# Patient Record
Sex: Female | Born: 1965 | Hispanic: Yes | Smoking: Never smoker
Health system: Southern US, Community
[De-identification: ages and names within clinical notes are randomized; demographics above are authoritative.]

## PROBLEM LIST (undated history)

## (undated) DIAGNOSIS — K5190 Ulcerative colitis, unspecified, without complications: Secondary | ICD-10-CM

## (undated) DIAGNOSIS — D649 Anemia, unspecified: Secondary | ICD-10-CM

## (undated) DIAGNOSIS — R079 Chest pain, unspecified: Secondary | ICD-10-CM

## (undated) DIAGNOSIS — Z531 Procedure and treatment not carried out because of patient's decision for reasons of belief and group pressure: Secondary | ICD-10-CM

## (undated) DIAGNOSIS — IMO0001 Reserved for inherently not codable concepts without codable children: Secondary | ICD-10-CM

## (undated) DIAGNOSIS — Z8489 Family history of other specified conditions: Secondary | ICD-10-CM

## (undated) HISTORY — PX: TONSILLECTOMY: SUR1361

## (undated) HISTORY — PX: OTHER SURGICAL HISTORY: SHX169

## (undated) HISTORY — PX: TUBAL LIGATION: SHX77

## (undated) HISTORY — PX: CHOLECYSTECTOMY: SHX55

---

## 2007-07-03 ENCOUNTER — Ambulatory Visit: Payer: Self-pay

## 2007-10-16 ENCOUNTER — Inpatient Hospital Stay: Payer: Self-pay | Admitting: Unknown Physician Specialty

## 2008-01-09 ENCOUNTER — Ambulatory Visit: Payer: Self-pay | Admitting: Internal Medicine

## 2008-04-17 ENCOUNTER — Ambulatory Visit: Payer: Self-pay | Admitting: Unknown Physician Specialty

## 2008-04-29 ENCOUNTER — Ambulatory Visit: Payer: Self-pay | Admitting: General Surgery

## 2008-09-23 ENCOUNTER — Ambulatory Visit: Payer: Self-pay | Admitting: Gastroenterology

## 2009-07-09 ENCOUNTER — Ambulatory Visit: Payer: Self-pay | Admitting: Internal Medicine

## 2011-05-08 ENCOUNTER — Ambulatory Visit: Payer: Self-pay | Admitting: Family Medicine

## 2013-06-11 ENCOUNTER — Emergency Department (HOSPITAL_COMMUNITY)
Admission: EM | Admit: 2013-06-11 | Discharge: 2013-06-11 | Disposition: A | Discharge status: Home / Self Care | Payer: Medicaid Other | Attending: Emergency Medicine | Admitting: Emergency Medicine

## 2013-06-11 ENCOUNTER — Encounter (HOSPITAL_COMMUNITY): Payer: Self-pay | Admitting: Emergency Medicine

## 2013-06-11 DIAGNOSIS — J029 Acute pharyngitis, unspecified: Secondary | ICD-10-CM | POA: Insufficient documentation

## 2013-06-11 DIAGNOSIS — R599 Enlarged lymph nodes, unspecified: Secondary | ICD-10-CM | POA: Insufficient documentation

## 2013-06-11 DIAGNOSIS — Z8719 Personal history of other diseases of the digestive system: Secondary | ICD-10-CM | POA: Insufficient documentation

## 2013-06-11 DIAGNOSIS — Z79899 Other long term (current) drug therapy: Secondary | ICD-10-CM | POA: Insufficient documentation

## 2013-06-11 HISTORY — DX: Ulcerative colitis, unspecified, without complications: K51.90

## 2013-06-11 MED ORDER — HYDROCODONE-ACETAMINOPHEN 7.5-325 MG/15ML PO SOLN: 10.0000 mL | Freq: Four times a day (QID) | ORAL | Status: DC | PRN

## 2013-06-11 MED ORDER — IBUPROFEN 800 MG PO TABS: 800.0000 mg | ORAL_TABLET | Freq: Three times a day (TID) | ORAL | Status: DC | PRN

## 2013-06-11 NOTE — ED Notes (Signed)
Pt sts cannot take ibuprofen due to ulcerative colitis.

## 2013-06-11 NOTE — ED Provider Notes (Signed)
CSN: [id number]     Arrival date & time 06/11/13  1325 History   First MD Initiated Contact with Patient 06/11/13 1424     Chief Complaint  Patient presents with  . Sore Throat   (Consider location/radiation/quality/duration/timing/severity/associated sxs/prior Treatment) HPI Comments: Patient reports she has had sore throat, body aches, right ear pain x4 days. States that her child is sick at home with the same. Denies cough, shortness of breath. The patient is eating and drinking normally  Patient is a 47 y.o. female presenting with pharyngitis. The history is provided by the patient.  Sore Throat Associated symptoms include a sore throat. Pertinent negatives include no chills, congestion, coughing or fever.    Past Medical History  Diagnosis Date  . Ulcerative colitis    History reviewed. No pertinent past surgical history. History reviewed. No pertinent family history. History  Substance Use Topics  . Smoking status: Never Smoker   . Smokeless tobacco: Not on file  . Alcohol Use: No   OB History   Grav Para Term Preterm Abortions TAB SAB Ect Mult Living                 Review of Systems  Constitutional: Negative for fever and chills.  HENT: Positive for ear pain and sore throat. Negative for congestion, rhinorrhea and trouble swallowing (Pain with swallowing, no difficulty).   Respiratory: Negative for cough and shortness of breath.     Allergies  Prednisone  Home Medications   Current Outpatient Rx  Name  Route  Sig  Dispense  Refill  . acetaminophen (TYLENOL) 500 MG tablet   Oral   Take 1,000 mg by mouth every 6 (six) hours as needed for pain.         . Probiotic Product (PROBIOTIC DAILY PO)   Oral   Take 1 tablet by mouth daily.          BP 136/84  Pulse 93  Temp(Src) 98.1 F (36.7 C) (Oral)  Resp 20  SpO2 100% Physical Exam  Nursing note and vitals reviewed. Constitutional: She appears well-developed and well-nourished. No distress.  HENT:   Head: Atraumatic. Macrocephalic.  Mouth/Throat: Uvula is midline. Mucous membranes are not dry. No edematous. Posterior oropharyngeal erythema present. No oropharyngeal exudate, posterior oropharyngeal edema or tonsillar abscesses.  Eyes: Conjunctivae are normal.  Neck: Normal range of motion. Neck supple. No tracheal deviation present.  Cardiovascular: Normal rate and regular rhythm.   Pulmonary/Chest: Effort normal and breath sounds normal. No stridor. No respiratory distress. She has no wheezes. She has no rales.  Lymphadenopathy:    She has cervical adenopathy.  Neurological: She is alert.  Skin: She is not diaphoretic.    ED Course  Procedures (including critical care time) Labs Review Labs Reviewed  RAPID STREP SCREEN  CULTURE, GROUP A STREP   Imaging Review No results found.  MDM   1. Pharyngitis    Patient with sore throat and body aches x4 days. Sick contacts with same at home. Strep test is negative here, culture sent. No airway concerns.  Patient discharged home with pain medication, strongly advised to drink plenty of fluids.Discussed result, findings, treatment, and follow up  with patient.  Pt given return precautions.  Pt verbalizes understanding and agrees with plan.      I doubt any other EMC precluding discharge at this time including, but not necessarily limited to the following: deep space neck infection.    Trixie Dredge, PA-C 06/11/13 1606

## 2013-06-11 NOTE — ED Notes (Signed)
Pt c/o sore throat and body aches x 4 days 

## 2013-06-12 NOTE — ED Provider Notes (Signed)
Medical screening examination/treatment/procedure(s) were performed by non-physician practitioner and as supervising physician I was immediately available for consultation/collaboration.    Gilda Crease, MD 06/12/13 1351

## 2013-11-10 DIAGNOSIS — R079 Chest pain, unspecified: Secondary | ICD-10-CM

## 2013-11-10 DIAGNOSIS — D649 Anemia, unspecified: Secondary | ICD-10-CM

## 2013-11-10 HISTORY — DX: Anemia, unspecified: D64.9

## 2013-11-10 HISTORY — DX: Chest pain, unspecified: R07.9

## 2013-12-01 ENCOUNTER — Emergency Department (HOSPITAL_COMMUNITY): Payer: Medicaid Other

## 2013-12-01 ENCOUNTER — Encounter (HOSPITAL_COMMUNITY): Payer: Self-pay | Admitting: Emergency Medicine

## 2013-12-01 ENCOUNTER — Observation Stay (HOSPITAL_COMMUNITY): Payer: Medicaid Other

## 2013-12-01 ENCOUNTER — Inpatient Hospital Stay (HOSPITAL_COMMUNITY)
Admission: EM | Admit: 2013-12-01 | Discharge: 2013-12-04 | DRG: 386 | Disposition: A | Discharge status: Home / Self Care | Payer: Medicaid Other | Attending: Internal Medicine | Admitting: Internal Medicine

## 2013-12-01 DIAGNOSIS — Z531 Procedure and treatment not carried out because of patient's decision for reasons of belief and group pressure: Secondary | ICD-10-CM

## 2013-12-01 DIAGNOSIS — Z79899 Other long term (current) drug therapy: Secondary | ICD-10-CM

## 2013-12-01 DIAGNOSIS — K5190 Ulcerative colitis, unspecified, without complications: Principal | ICD-10-CM | POA: Diagnosis present

## 2013-12-01 DIAGNOSIS — D62 Acute posthemorrhagic anemia: Secondary | ICD-10-CM | POA: Diagnosis present

## 2013-12-01 DIAGNOSIS — K921 Melena: Secondary | ICD-10-CM | POA: Diagnosis present

## 2013-12-01 DIAGNOSIS — D649 Anemia, unspecified: Secondary | ICD-10-CM

## 2013-12-01 DIAGNOSIS — D500 Iron deficiency anemia secondary to blood loss (chronic): Secondary | ICD-10-CM | POA: Diagnosis present

## 2013-12-01 DIAGNOSIS — R079 Chest pain, unspecified: Secondary | ICD-10-CM | POA: Diagnosis present

## 2013-12-01 HISTORY — DX: Procedure and treatment not carried out because of patient's decision for reasons of belief and group pressure: Z53.1

## 2013-12-01 HISTORY — DX: Reserved for inherently not codable concepts without codable children: IMO0001

## 2013-12-01 HISTORY — DX: Anemia, unspecified: D64.9

## 2013-12-01 HISTORY — DX: Chest pain, unspecified: R07.9

## 2013-12-01 HISTORY — DX: Family history of other specified conditions: Z84.89

## 2013-12-01 LAB — LIPASE, BLOOD: Lipase: 37 U/L (ref 11–59)

## 2013-12-01 LAB — HEPATIC FUNCTION PANEL
ALT: 7 U/L (ref 0–35)
AST: 11 U/L (ref 0–37)
Albumin: 2.5 g/dL — ABNORMAL LOW (ref 3.5–5.2)
Alkaline Phosphatase: 54 U/L (ref 39–117)
Bilirubin, Direct: <0.2 mg/dL (ref 0.0–0.3)
Total Bilirubin: <0.2 mg/dL — ABNORMAL LOW (ref 0.3–1.2)
Total Protein: 7 g/dL (ref 6.0–8.3)

## 2013-12-01 LAB — CBC WITH DIFFERENTIAL/PLATELET
Basophils Absolute: 0 10*3/uL (ref 0.0–0.1)
Basophils Relative: 0 % (ref 0–1)
Eosinophils Absolute: 0.4 10*3/uL (ref 0.0–0.7)
Eosinophils Relative: 3 % (ref 0–5)
HCT: 22.3 % — ABNORMAL LOW (ref 36.0–46.0)
Hemoglobin: 7 g/dL — ABNORMAL LOW (ref 12.0–15.0)
Lymphocytes Relative: 12 % (ref 12–46)
Lymphs Abs: 1.6 10*3/uL (ref 0.7–4.0)
MCH: 24.1 pg — ABNORMAL LOW (ref 26.0–34.0)
MCHC: 31.4 g/dL (ref 30.0–36.0)
MCV: 76.6 fL — ABNORMAL LOW (ref 78.0–100.0)
Monocytes Absolute: 1.3 10*3/uL — ABNORMAL HIGH (ref 0.1–1.0)
Monocytes Relative: 10 % (ref 3–12)
Neutro Abs: 9.7 10*3/uL — ABNORMAL HIGH (ref 1.7–7.7)
Neutrophils Relative %: 75 % (ref 43–77)
Platelets: 767 10*3/uL — ABNORMAL HIGH (ref 150–400)
RBC: 2.91 MIL/uL — ABNORMAL LOW (ref 3.87–5.11)
RDW: 15.6 % — ABNORMAL HIGH (ref 11.5–15.5)
WBC Morphology: INCREASED
WBC: 13 10*3/uL — ABNORMAL HIGH (ref 4.0–10.5)

## 2013-12-01 LAB — BASIC METABOLIC PANEL
BUN: 6 mg/dL (ref 6–23)
CO2: 26 mEq/L (ref 19–32)
Calcium: 8.6 mg/dL (ref 8.4–10.5)
Chloride: 98 mEq/L (ref 96–112)
Creatinine, Ser: 0.63 mg/dL (ref 0.50–1.10)
GFR calc Af Amer: >90 mL/min (ref >90)
GFR calc non Af Amer: >90 mL/min (ref >90)
Glucose, Bld: 82 mg/dL (ref 70–99)
Potassium: 3.8 mEq/L (ref 3.7–5.3)
Sodium: 137 mEq/L (ref 137–147)

## 2013-12-01 LAB — POC OCCULT BLOOD, ED: FECAL OCCULT BLD: NEGATIVE

## 2013-12-01 LAB — PRO B NATRIURETIC PEPTIDE: Pro B Natriuretic peptide (BNP): 90.7 pg/mL (ref 0–125)

## 2013-12-01 LAB — D-DIMER, QUANTITATIVE (NOT AT ARMC): D DIMER QUANT: 1.26 ug{FEU}/mL — AB (ref 0.00–0.48)

## 2013-12-01 LAB — TROPONIN I
Troponin I: <0.3 ng/mL (ref <0.30)
Troponin I: <0.3 ng/mL (ref <0.30)

## 2013-12-01 MED ORDER — SODIUM CHLORIDE 0.9 % IV SOLN
INTRAVENOUS | Status: DC
  Administered 2013-12-01 – 2013-12-04 (×2): via INTRAVENOUS

## 2013-12-01 MED ORDER — MORPHINE SULFATE 2 MG/ML IJ SOLN: 2.0000 mg | INTRAMUSCULAR | Status: DC | PRN

## 2013-12-01 MED ORDER — IOHEXOL 300 MG/ML  SOLN
80.0000 mL | Freq: Once | INTRAMUSCULAR | Status: AC | PRN
Start: 2013-12-01 — End: 2013-12-01
  Administered 2013-12-01: 65 mL via INTRAVENOUS

## 2013-12-01 MED ORDER — ONDANSETRON HCL 4 MG PO TABS: 4.0000 mg | ORAL_TABLET | Freq: Four times a day (QID) | ORAL | Status: DC | PRN

## 2013-12-01 MED ORDER — IOHEXOL 300 MG/ML  SOLN: 25.0000 mL | INTRAMUSCULAR | Status: DC

## 2013-12-01 MED ORDER — GI COCKTAIL ~~LOC~~
30.0000 mL | Freq: Once | ORAL | Status: AC
  Administered 2013-12-01: 30 mL via ORAL
  Filled 2013-12-01: qty 30

## 2013-12-01 MED ORDER — ONDANSETRON HCL 4 MG/2ML IJ SOLN: 4.0000 mg | Freq: Four times a day (QID) | INTRAMUSCULAR | Status: DC | PRN

## 2013-12-01 NOTE — ED Provider Notes (Signed)
CSN: [id number]     Arrival date & time 12/01/13  1416 History   First MD Initiated Contact with Patient 12/01/13 1500     Chief Complaint  Patient presents with  . Chest Pain    worse when standing     (Consider location/radiation/quality/duration/timing/severity/associated sxs/prior Treatment) HPI Comments: Patient presents with substernal chest pain onset last night around 8 PM. The pain came on at rest and has been constant all night long and all day today. He acutely got worse this afternoon and became more stabbing about 2 hours ago. It radiates to her left arm and upper back. Is associated with shortness of breath and nausea. She had received nitroglycerin from EMS was improved the pain. She denies any cardiac history. She's never had a stress test. Her only medical history is ulcerative colitis. She does not smoke. She never had this pain before yesterday. Is better with lying down worse with sitting up. She has some soreness with palpation of her chest wall movement of her left arm. She denies any symptoms currently.  The history is provided by the patient and the EMS personnel.    Past Medical History  Diagnosis Date  . Ulcerative colitis    Past Surgical History  Procedure Laterality Date  . Tubal ligation    . Cholecystectomy    . Tonsillectomy     No family history on file. History  Substance Use Topics  . Smoking status: Never Smoker   . Smokeless tobacco: Not on file  . Alcohol Use: No   OB History   Grav Para Term Preterm Abortions TAB SAB Ect Mult Living                 Review of Systems  Constitutional: Negative for fever, activity change and appetite change.  HENT: Negative for congestion and rhinorrhea.   Respiratory: Positive for chest tightness and shortness of breath. Negative for cough.   Cardiovascular: Positive for chest pain.  Gastrointestinal: Positive for nausea. Negative for vomiting and abdominal pain.  Genitourinary: Negative for dysuria,  hematuria and vaginal bleeding.  Musculoskeletal: Negative for back pain.  Skin: Negative for rash.  Neurological: Negative for dizziness, weakness and headaches.  A complete 10 system review of systems was obtained and all systems are negative except as noted in the HPI and PMH.      Allergies  Prednisone  Home Medications   No current outpatient prescriptions on file. BP 123/72  Pulse 76  Temp(Src) 98 F (36.7 C) (Oral)  Resp 18  Ht 5\' 6"  (1.676 m)  Wt 156 lb 3.2 oz (70.852 kg)  BMI 25.22 kg/m2  SpO2 100%  LMP 11/11/2013 Physical Exam  Constitutional: She is oriented to person, place, and time. She appears well-developed and well-nourished. No distress.  HENT:  Head: Normocephalic and atraumatic.  Mouth/Throat: Oropharynx is clear and moist. No oropharyngeal exudate.  Eyes: Conjunctivae and EOM are normal. Pupils are equal, round, and reactive to light.  Neck: Normal range of motion. Neck supple.  Cardiovascular: Normal rate, regular rhythm and normal heart sounds.   Pulmonary/Chest: Effort normal and breath sounds normal. No respiratory distress. She exhibits tenderness.  TTP L chest wall, reproducible, worse with arm movement.  Abdominal: Soft. There is no tenderness. There is no rebound and no guarding.  Musculoskeletal: Normal range of motion. She exhibits no edema and no tenderness.  Equal radial pulses  Neurological: She is alert and oriented to person, place, and time. No cranial nerve deficit. She  exhibits normal muscle tone. Coordination normal.  Skin: Skin is warm.    ED Course  Procedures (including critical care time) Labs Review Labs Reviewed  CBC WITH DIFFERENTIAL - Abnormal; Notable for the following:    WBC 13.0 (*)    RBC 2.91 (*)    Hemoglobin 7.0 (*)    HCT 22.3 (*)    MCV 76.6 (*)    MCH 24.1 (*)    RDW 15.6 (*)    Platelets 767 (*)    Neutro Abs 9.7 (*)    Monocytes Absolute 1.3 (*)    All other components within normal limits  D-DIMER,  QUANTITATIVE - Abnormal; Notable for the following:    D-Dimer, Quant 1.26 (*)    All other components within normal limits  HEPATIC FUNCTION PANEL - Abnormal; Notable for the following:    Albumin 2.5 (*)    Total Bilirubin <0.2 (*)    All other components within normal limits  TROPONIN I  BASIC METABOLIC PANEL  LIPASE, BLOOD  PRO B NATRIURETIC PEPTIDE  TROPONIN I  CBC  COMPREHENSIVE METABOLIC PANEL  TROPONIN I  TROPONIN I  TROPONIN I  POC OCCULT BLOOD, ED   Imaging Review Ct Abdomen Pelvis Wo Contrast  12/01/2013   CLINICAL DATA:  Left upper quadrant lymphadenopathy noted on CT of the chest; chest pain. History of ulcerative colitis.  EXAM: CT ABDOMEN AND PELVIS WITHOUT CONTRAST  TECHNIQUE: Multidetector CT imaging of the abdomen and pelvis was performed following the standard protocol without intravenous contrast.  COMPARISON:  CTA of the chest performed earlier today at 8:41 p.m.  FINDINGS: The visualized lung bases are clear. Prominent extrapleural fat is noted at the right lung base.  The liver and spleen are unremarkable in appearance. The patient is status post cholecystectomy, with clips noted along the gallbladder fossa. The pancreas and adrenal glands are unremarkable.  Contrast is seen filling the renal calyces, limiting evaluation for renal stones. A few small left-sided parapelvic renal cysts are seen. The kidneys are otherwise unremarkable in appearance. There is no evidence of hydronephrosis. No significant perinephric stranding is appreciated.  No free fluid is identified. The small bowel is unremarkable in appearance. The stomach is within normal limits. No acute vascular abnormalities are seen.  The appendix is grossly unremarkable in appearance; there is no evidence for appendicitis.  There is mild diffuse wall thickening and soft inflammation along the entirety of the colon. Associated pericecal lymphadenopathy is seen, and scattered small nodes are noted along the  transverse colon adjacent to the pancreatic head. There are also prominent mesenteric nodes at the left upper quadrant, measuring up to 1.4 cm in short axis. This may reflect the patient's ulcerative colitis; the diffuse nature of these nodes suggests against malignancy, though it cannot be excluded.  The bladder is mildly distended and filled with contrast, and is unremarkable in appearance. The uterus is within normal limits. The ovaries are grossly symmetric, though difficult to fully assess. No suspicious adnexal masses are seen. No inguinal lymphadenopathy is seen.  No acute osseous abnormalities are identified. A large 2.8 cm mildly irregular sclerotic focus is noted at the left side of the sacrum; this may reflect a bone island, though a sclerotic mass cannot be entirely excluded. Bone scan could be considered for further evaluation.  IMPRESSION: 1. Mild diffuse wall thickening and soft tissue inflammation along the entirety of the colon, with associated scattered lymphadenopathy along the course of the colon. Prominent mesenteric nodes seen at the  left upper quadrant, measuring up to 1.4 cm in short axis. Findings may reflect the patient's ulcerative colitis; the diffuse nature of these nodes suggests against malignancy, though it cannot be excluded. Would suggest correlation with colonoscopy, after resolution of the patient's acute exacerbation of ulcerative colitis. 2. 2.8 cm mildly irregular sclerotic focus at the left side of the sacrum; this may simply reflect a bone island, though a sclerotic mass cannot be entirely excluded. Bone scan could be considered for further evaluation, as deemed clinically appropriate. 3. Few small left-sided renal parapelvic cysts seen.   Electronically Signed   By: Roanna Raider M.D.   On: 12/01/2013 21:34   Dg Chest 2 View  12/01/2013   CLINICAL DATA:  Chest pain and shortness of breath  EXAM: CHEST  2 VIEW  COMPARISON:  None.  FINDINGS: The heart size and mediastinal  contours are within normal limits. Both lungs are clear. The visualized skeletal structures are unremarkable.  IMPRESSION: No active cardiopulmonary disease.   Electronically Signed   By: Ruel Favors M.D.   On: 12/01/2013 15:31   Ct Angio Chest Pe W/cm &/or Wo Cm  12/01/2013   ADDENDUM REPORT: 12/01/2013 18:14  ADDENDUM: There are multiple nodules in the thyroid gland with the largest being 16 mm. If the thyroid gland has not been previously assessed, thyroid ultrasound is recommended on an elective basis for further evaluation.   Electronically Signed   By: Geanie Cooley M.D.   On: 12/01/2013 18:14   12/01/2013   CLINICAL DATA:  Chest pain and tachycardia.  Shortness of breath.  EXAM: CT ANGIOGRAPHY CHEST WITH CONTRAST  TECHNIQUE: Multidetector CT imaging of the chest was performed using the standard protocol during bolus administration of intravenous contrast. Multiplanar CT image reconstructions and MIPs were obtained to evaluate the vascular anatomy.  CONTRAST:  65mL OMNIPAQUE IOHEXOL 300 MG/ML  SOLN  COMPARISON:  Chest x-ray dated 12/01/2013  FINDINGS: There are no pulmonary emboli. The lungs are clear. No hilar or mediastinal adenopathy. Heart size is normal. 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 This has a benign appearance.  There are multiple small soft tissue nodules in the left upper quadrant of the abdomen. I think these represent mesenteric adenopathy rather than accessory spleens. There is 1 nodule near the splenic hilum that measures 11 mm in diameter on image number 89 of series 4 which may be an accessory spleen.  The rest of the visualized portion of the abdomen appears normal.  Review of the MIP images confirms the above findings.  IMPRESSION: 1. No pulmonary emboli or other acute abnormalities in the chest. 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 2. Suggestion of  mesenteric adenopathy in the left upper quadrant. I recommend CT scan of the abdomen for further evaluation.  Electronically Signed: By: Geanie Cooley M.D. On: 12/01/2013 18:04      MDM   Final diagnoses:  Chest pain  Anemia   Substernal chest pain since last night that has been constant but worsened this afternoon. Worse with palpation or movement. Worse with sitting up. Her with lying down. EKG with normal sinus rhythm without ST changes.  EKG normal sinus rhythm. No ST changes. Pain is been constant since last night and is worse with movement and palpation. Atypical for ACS. Chest x-ray is negative. Troponin is negative. CTPE negative  Incidentally hemoglobin noted to  be 7. No previous comparisons. Patient endorses history of ulcer colitis and says she has frequently bloody bowel movements. She is guaiac negative today. she refuses blood transfusion for religious reasons.  Anemia may be contributing to chest pain.   Her pain is atypical for ACS, her anemia may be contributing. She is agreeable to observation admission for serial enzymes and further monitoring of her hemoglobin.    Date: 12/02/2013  Rate: 91  Rhythm: normal sinus rhythm  QRS Axis: normal  Intervals: normal  ST/T Wave abnormalities: normal  Conduction Disutrbances:none  Narrative Interpretation:   Old EKG Reviewed: none available    Glynn OctaveStephen Karlisha Mathena, MD 12/02/13 0005

## 2013-12-01 NOTE — ED Notes (Signed)
Pt stated she does not want blood transfusion due to religious reasons.

## 2013-12-01 NOTE — ED Notes (Signed)
PT ambulated to room without assistance.

## 2013-12-01 NOTE — ED Notes (Signed)
Transporting Patient to new room assignment. 

## 2013-12-01 NOTE — ED Notes (Signed)
MD at bedside. 

## 2013-12-01 NOTE — H&P (Addendum)
PCP:   Benito Mccreedy, MD   Chief Complaint:  Chest pain  HPI:  48 year old female who  has a past medical history of Ulcerative colitis. today presented to the ED chief complaint of chest pain, shortness of breath, palpitations was started this afternoon. As per patient she has been having intermittent chest in last night. Chest pain is stabbing in character, intermittent, gets worse on walking and movement. At this time pain is 2/10 in intensity. Also has been using palpitations lungs shortness of breath especially on walking. She denies any fever no nausea vomiting but admits to having diarrhea. She has a history of ulcerative colitis and usually has 10-15 bowel movements every day. Stool guaiac was negative today but patient has been passing blood in the stools over the past few days.  No dysuria urgency frequency of urination.  In the ED patient was found to have elevated d-dimer, CT angiogram which is negative for PE but shows left upper quadrant mesenteric adenopathy. CT abdomen pelvis has been ordered Patient also found to be anemic with hemoglobin 7.0, patient refuses blood transfusion due to religious reasons. Cardiac enzymes are negative in the ED, EKG shows normal sinus rhythm.  Allergies:   Allergies  Allergen Reactions  . Prednisone Other (See Comments)    Abdominal pain       Past Medical History  Diagnosis Date  . Ulcerative colitis     Past Surgical History  Procedure Laterality Date  . Tubal ligation    . Cholecystectomy    . Tonsillectomy      Prior to Admission medications   Medication Sig Start Date End Date Taking? Authorizing Provider  Omega-3 Fatty Acids (FISH OIL) 1000 MG CAPS Take 2 capsules by mouth daily.   Yes Historical Provider, MD  Probiotic Product (PROBIOTIC DAILY PO) Take 1 tablet by mouth daily.   Yes Historical Provider, MD    Social History:  reports that she has never smoked. She does not have any smokeless tobacco history on file.  She reports that she does not drink alcohol or use illicit drugs.  Patient's mother has a history of congestive heart failure  All the positives are listed in BOLD  Review of Systems:  HEENT: Headache, blurred vision, runny nose, sore throat Neck: Hypothyroidism, hyperthyroidism,,lymphadenopathy Chest : Shortness of breath, history of COPD, Asthma Heart : Chest pain, history of coronary arterey disease GI:  Nausea, vomiting, diarrhea, constipation, GERD GU: Dysuria, urgency, frequency of urination, hematuria Neuro: Stroke, seizures, syncope Psych: Depression, anxiety, hallucinations   Physical Exam: Blood pressure 111/60, pulse 83, temperature 98.3 F (36.8 C), temperature source Oral, resp. rate 13, last menstrual period 11/11/2013, SpO2 100.00%. Constitutional:   Patient is a well-developed and well-nourished female in no acute distress and cooperative with exam. Head: Normocephalic and atraumatic Mouth: Mucus membranes moist Eyes: PERRL, EOMI, conjunctivae normal Neck: Supple, No Thyromegaly Cardiovascular: RRR, S1 normal, S2 normal Pulmonary/Chest: CTAB, no wheezes, rales, or rhonchi Chest wall- positive chest wall tenderness at the lower sternum Abdominal: Soft. Non-tender, non-distended, bowel sounds are normal, no masses, organomegaly, or guarding present.  Neurological: A&O x3, Strenght is normal and symmetric bilaterally, cranial nerve II-XII are grossly intact, no focal motor deficit, sensory intact to light touch bilaterally.  Extremities : No Cyanosis, Clubbing or Edema   Labs on Admission:  Results for orders placed during the hospital encounter of 12/01/13 (from the past 48 hour(s))  CBC WITH DIFFERENTIAL     Status: Abnormal   Collection Time  12/01/13  3:55 PM      Result Value Ref Range   WBC 13.0 (*) 4.0 - 10.5 K/uL   RBC 2.91 (*) 3.87 - 5.11 MIL/uL   Hemoglobin 7.0 (*) 12.0 - 15.0 g/dL   HCT 22.3 (*) 36.0 - 46.0 %   MCV 76.6 (*) 78.0 - 100.0 fL   MCH  24.1 (*) 26.0 - 34.0 pg   MCHC 31.4  30.0 - 36.0 g/dL   RDW 15.6 (*) 11.5 - 15.5 %   Platelets 767 (*) 150 - 400 K/uL   Neutrophils Relative % 75  43 - 77 %   Lymphocytes Relative 12  12 - 46 %   Monocytes Relative 10  3 - 12 %   Eosinophils Relative 3  0 - 5 %   Basophils Relative 0  0 - 1 %   Neutro Abs 9.7 (*) 1.7 - 7.7 K/uL   Lymphs Abs 1.6  0.7 - 4.0 K/uL   Monocytes Absolute 1.3 (*) 0.1 - 1.0 K/uL   Eosinophils Absolute 0.4  0.0 - 0.7 K/uL   Basophils Absolute 0.0  0.0 - 0.1 K/uL   RBC Morphology POLYCHROMASIA PRESENT     WBC Morphology INCREASED BANDS (>20% BANDS)    TROPONIN I     Status: None   Collection Time    12/01/13  3:55 PM      Result Value Ref Range   Troponin I <0.30  <0.30 ng/mL   Comment:            Due to the release kinetics of cTnI,     a negative result within the first hours     of the onset of symptoms does not rule out     myocardial infarction with certainty.     If myocardial infarction is still suspected,     repeat the test at appropriate intervals.  BASIC METABOLIC PANEL     Status: None   Collection Time    12/01/13  3:55 PM      Result Value Ref Range   Sodium 137  137 - 147 mEq/L   Potassium 3.8  3.7 - 5.3 mEq/L   Chloride 98  96 - 112 mEq/L   CO2 26  19 - 32 mEq/L   Glucose, Bld 82  70 - 99 mg/dL   BUN 6  6 - 23 mg/dL   Creatinine, Ser 0.63  0.50 - 1.10 mg/dL   Calcium 8.6  8.4 - 10.5 mg/dL   GFR calc non Af Amer >90  >90 mL/min   GFR calc Af Amer >90  >90 mL/min   Comment: (NOTE)     The eGFR has been calculated using the CKD EPI equation.     This calculation has not been validated in all clinical situations.     eGFR's persistently <90 mL/min signify possible Chronic Kidney     Disease.  D-DIMER, QUANTITATIVE     Status: Abnormal   Collection Time    12/01/13  3:55 PM      Result Value Ref Range   D-Dimer, Quant 1.26 (*) 0.00 - 0.48 ug/mL-FEU   Comment:            AT THE INHOUSE ESTABLISHED CUTOFF     VALUE OF 0.48 ug/mL  FEU,     THIS ASSAY HAS BEEN DOCUMENTED     IN THE LITERATURE TO HAVE     A SENSITIVITY AND NEGATIVE     PREDICTIVE VALUE OF AT  LEAST     98 TO 99%.  THE TEST RESULT     SHOULD BE CORRELATED WITH     AN ASSESSMENT OF THE CLINICAL     PROBABILITY OF DVT / VTE.  HEPATIC FUNCTION PANEL     Status: Abnormal   Collection Time    12/01/13  3:55 PM      Result Value Ref Range   Total Protein 7.0  6.0 - 8.3 g/dL   Albumin 2.5 (*) 3.5 - 5.2 g/dL   AST 11  0 - 37 U/L   ALT 7  0 - 35 U/L   Alkaline Phosphatase 54  39 - 117 U/L   Total Bilirubin <0.2 (*) 0.3 - 1.2 mg/dL   Bilirubin, Direct <0.2  0.0 - 0.3 mg/dL   Indirect Bilirubin NOT CALCULATED  0.3 - 0.9 mg/dL  LIPASE, BLOOD     Status: None   Collection Time    12/01/13  3:55 PM      Result Value Ref Range   Lipase 37  11 - 59 U/L  PRO B NATRIURETIC PEPTIDE     Status: None   Collection Time    12/01/13  3:55 PM      Result Value Ref Range   Pro B Natriuretic peptide (BNP) 90.7  0 - 125 pg/mL  POC OCCULT BLOOD, ED     Status: None   Collection Time    12/01/13  4:53 PM      Result Value Ref Range   Fecal Occult Bld NEGATIVE  NEGATIVE    Radiological Exams on Admission: Dg Chest 2 View  12/01/2013   CLINICAL DATA:  Chest pain and shortness of breath  EXAM: CHEST  2 VIEW  COMPARISON:  None.  FINDINGS: The heart size and mediastinal contours are within normal limits. Both lungs are clear. The visualized skeletal structures are unremarkable.  IMPRESSION: No active cardiopulmonary disease.   Electronically Signed   By: Daryll Brod M.D.   On: 12/01/2013 15:31   Ct Angio Chest Pe W/cm &/or Wo Cm  12/01/2013   ADDENDUM REPORT: 12/01/2013 18:14  ADDENDUM: There are multiple nodules in the thyroid gland with the largest being 16 mm. If the thyroid gland has not been previously assessed, thyroid ultrasound is recommended on an elective basis for further evaluation.   Electronically Signed   By: Rozetta Nunnery M.D.   On: 12/01/2013 18:14    12/01/2013   CLINICAL DATA:  Chest pain and tachycardia.  Shortness of breath.  EXAM: CT ANGIOGRAPHY CHEST WITH CONTRAST  TECHNIQUE: Multidetector CT imaging of the chest was performed using the standard protocol during bolus administration of intravenous contrast. Multiplanar CT image reconstructions and MIPs were obtained to evaluate the vascular anatomy.  CONTRAST:  21m OMNIPAQUE IOHEXOL 300 MG/ML  SOLN  COMPARISON:  Chest x-ray dated 12/01/2013  FINDINGS: There are no pulmonary emboli. The lungs are clear. No hilar or mediastinal adenopathy. Heart size is normal. 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 This has a benign appearance.  There are multiple small soft tissue nodules in the left upper quadrant of the abdomen. I think these represent mesenteric adenopathy rather than accessory spleens. There is 1 nodule near the splenic hilum that measures 11 mm in diameter on image number 89 of series 4 which may be an accessory spleen.  The rest of the visualized portion of the  abdomen appears normal.  Review of the MIP images confirms the above findings.  IMPRESSION: 1. No pulmonary emboli or other acute abnormalities in the chest. 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 2. Suggestion of mesenteric adenopathy in the left upper quadrant. I recommend CT scan of the abdomen for further evaluation.  Electronically Signed: By: Rozetta Nunnery M.D. On: 12/01/2013 18:04    Assessment/Plan Principal Problem:   Chest pain Active Problems:   Ulcerative colitis   Anemia  Chest pain Appears atypical at this time as it is tender to palpation. We'll admit under telemetry to rule out ACS. We'll give morphine when necessary for the pain. Cardiac enzymes are negative in the ED, will follow troponin every 6 hours x3.  Shortness of breath, palpitations Patient may be having the symptoms due to anemia  as his hemoglobin is 7.0 Will continue to monitor the cardiac enzymes  Anemia Secondary to lower GI bleed due to ulcerative colitis Patient follows Dr. Benson Norway as outpatient She has an appointment to see Dr. Geoffry Paradise Stool guaiac was negative in the ED Patient has refused blood transfusion due to religious reasons Check CBC in the morning, if hemoglobin less than 7 discussed with patient again to reconsider her decision  History of ulcerative colitis Patient says she is having 10-15 loose bowel movements  Everyday Will give IV fluids Will  follow GI as outpatient  Mesenteric adenopathy CT abdomen has been ordered to check for left upper quadrant mesenteric adenopathy Will follow CT abdominal results  DVT prophylaxis SCDs  Code status: Patient is full code  Family discussion: Discussed with patient's brother at bedside   Time Spent on Admission: 36 minutes  [facility] Pager: [phone] 12/01/2013, 7:31 PM  If 7PM-7AM, please contact night-coverage  [email or web address]  Password [email or web address]

## 2013-12-01 NOTE — ED Notes (Signed)
Per EMS, pt c/o chest pain that is worse when standing. State performed orthostatic VSs - no change in BP or pulse. States pt is tachycardic - sinus rhythm. States gave pt Nitro x 3 which reduced chest pain from 7/10 - 2/10. States pt had vomiting yesterday and today. C/o dizziness after chest pain starts.

## 2013-12-01 NOTE — ED Notes (Signed)
[facility]  Pt to go to CT in approx 35 minutes. Will keep here until CT completed, then send pt to floor.

## 2013-12-01 NOTE — ED Notes (Addendum)
Patient reports have a sharp chest pain while walking to the restroom.

## 2013-12-02 ENCOUNTER — Encounter (HOSPITAL_COMMUNITY): Payer: Self-pay | Admitting: General Practice

## 2013-12-02 DIAGNOSIS — I519 Heart disease, unspecified: Secondary | ICD-10-CM

## 2013-12-02 LAB — CBC
HCT: 20.8 % — ABNORMAL LOW (ref 36.0–46.0)
Hemoglobin: 6.6 g/dL — CL (ref 12.0–15.0)
MCH: 24.2 pg — ABNORMAL LOW (ref 26.0–34.0)
MCHC: 31.7 g/dL (ref 30.0–36.0)
MCV: 76.2 fL — AB (ref 78.0–100.0)
PLATELETS: 694 10*3/uL — AB (ref 150–400)
RBC: 2.73 MIL/uL — AB (ref 3.87–5.11)
RDW: 15.7 % — ABNORMAL HIGH (ref 11.5–15.5)
WBC: 14.2 10*3/uL — ABNORMAL HIGH (ref 4.0–10.5)

## 2013-12-02 LAB — URINALYSIS, ROUTINE W REFLEX MICROSCOPIC
BILIRUBIN URINE: NEGATIVE
Glucose, UA: NEGATIVE mg/dL
Ketones, ur: NEGATIVE mg/dL
Nitrite: NEGATIVE
PH: 8 (ref 5.0–8.0)
Protein, ur: NEGATIVE mg/dL
SPECIFIC GRAVITY, URINE: 1.008 (ref 1.005–1.030)
UROBILINOGEN UA: 0.2 mg/dL (ref 0.0–1.0)

## 2013-12-02 LAB — COMPREHENSIVE METABOLIC PANEL
ALBUMIN: 2.3 g/dL — AB (ref 3.5–5.2)
ALT: 7 U/L (ref 0–35)
AST: 10 U/L (ref 0–37)
Alkaline Phosphatase: 49 U/L (ref 39–117)
BUN: 5 mg/dL — ABNORMAL LOW (ref 6–23)
CO2: 27 meq/L (ref 19–32)
Calcium: 8.3 mg/dL — ABNORMAL LOW (ref 8.4–10.5)
Chloride: 98 mEq/L (ref 96–112)
Creatinine, Ser: 0.65 mg/dL (ref 0.50–1.10)
GFR calc Af Amer: >90 mL/min (ref >90)
Glucose, Bld: 125 mg/dL — ABNORMAL HIGH (ref 70–99)
POTASSIUM: 3.4 meq/L — AB (ref 3.7–5.3)
Sodium: 136 mEq/L — ABNORMAL LOW (ref 137–147)
Total Protein: 6.5 g/dL (ref 6.0–8.3)

## 2013-12-02 LAB — URINE MICROSCOPIC-ADD ON

## 2013-12-02 LAB — TROPONIN I
Troponin I: <0.3 ng/mL (ref <0.30)
Troponin I: <0.3 ng/mL (ref <0.30)

## 2013-12-02 MED ORDER — IRON DEXTRAN 50 MG/ML IJ SOLN
25.0000 mg | Freq: Once | INTRAMUSCULAR | Status: AC
  Administered 2013-12-02: 25 mg via INTRAVENOUS
  Filled 2013-12-02: qty 0.5

## 2013-12-02 MED ORDER — SODIUM CHLORIDE 0.9 % IV SOLN
1000.0000 mg | Freq: Once | INTRAVENOUS | Status: AC
  Administered 2013-12-02: 1000 mg via INTRAVENOUS
  Filled 2013-12-02: qty 20

## 2013-12-02 MED ORDER — POTASSIUM CHLORIDE CRYS ER 20 MEQ PO TBCR
40.0000 meq | EXTENDED_RELEASE_TABLET | Freq: Once | ORAL | Status: AC
  Administered 2013-12-02: 40 meq via ORAL
  Filled 2013-12-02: qty 2

## 2013-12-02 NOTE — Consult Note (Signed)
Reason for Consult: Ulcerative Colitis Referring Physician: [facility]  [name] HPI: This is a 48 year old female with a PMH of UC admitted for complaints of chest pain and SOB.  Kelli Whitaker was identified to have an HGB in the 6 range.  Kelli Whitaker has uncontrolled UC and recently I evaluated the patient in the office.  Kelli Whitaker was tried on [place] after identifying a severe pan colitis.  Unfortunately her symptoms worsened with the treatment.  In the follow up office visit Kelli Whitaker consented to starting on AZA, but the medication was not initiated.  Her blood work for the TPMT is still pending at this time.  Discussions for starting on an anti-TNF were made, but Kelli Whitaker is not ready to take that step.  Past Medical History  Diagnosis Date  . Ulcerative colitis   . Family history of anesthesia complication     ' My Brother went into cardiac arrest "  . Chest pain 11/2013  . Refusal of blood transfusions as patient is Jehovah's Witness   . Anemia 11/2013    Past Surgical History  Procedure Laterality Date  . Tubal ligation    . Cholecystectomy    . Tonsillectomy      History reviewed. No pertinent family history.  Social History:  reports that Kelli Whitaker has never smoked. Kelli Whitaker has never used smokeless tobacco. Kelli Whitaker reports that Kelli Whitaker does not drink alcohol or use illicit drugs.  Allergies:  Allergies  Allergen Reactions  . Prednisone Other (See Comments)    Abdominal pain     Medications:  Scheduled: . iron dextran (INFED/DEXFERRUM) 1000 MG IVPB  1,000 mg Intravenous Once   Continuous: . sodium chloride 75 mL/hr at 12/01/13 2335    Results for orders placed during the hospital encounter of 12/01/13 (from the past 24 hour(s))  TROPONIN I     Status: None   Collection Time    12/01/13  6:00 PM      Result Value Ref Range   Troponin I <0.30  <0.30 ng/mL  CBC     Status: Abnormal   Collection Time    12/02/13 12:09 AM      Result Value Ref Range   WBC 14.2 (*) 4.0 - 10.5 K/uL   RBC  2.73 (*) 3.87 - 5.11 MIL/uL   Hemoglobin 6.6 (*) 12.0 - 15.0 g/dL   HCT 16.1 (*) 09.6 - 04.5 %   MCV 76.2 (*) 78.0 - 100.0 fL   MCH 24.2 (*) 26.0 - 34.0 pg   MCHC 31.7  30.0 - 36.0 g/dL   RDW 40.9 (*) 81.1 - 91.4 %   Platelets 694 (*) 150 - 400 K/uL  COMPREHENSIVE METABOLIC PANEL     Status: Abnormal   Collection Time    12/02/13 12:09 AM      Result Value Ref Range   Sodium 136 (*) 137 - 147 mEq/L   Potassium 3.4 (*) 3.7 - 5.3 mEq/L   Chloride 98  96 - 112 mEq/L   CO2 27  19 - 32 mEq/L   Glucose, Bld 125 (*) 70 - 99 mg/dL   BUN 5 (*) 6 - 23 mg/dL   Creatinine, Ser 7.82  0.50 - 1.10 mg/dL   Calcium 8.3 (*) 8.4 - 10.5 mg/dL   Total Protein 6.5  6.0 - 8.3 g/dL   Albumin 2.3 (*) 3.5 - 5.2 g/dL   AST 10  0 - 37 U/L   ALT 7  0 - 35 U/L  Alkaline Phosphatase 49  39 - 117 U/L   Total Bilirubin <0.2 (*) 0.3 - 1.2 mg/dL   GFR calc non Af Amer >90  >90 mL/min   GFR calc Af Amer >90  >90 mL/min  TROPONIN I     Status: None   Collection Time    12/02/13 12:09 AM      Result Value Ref Range   Troponin I <0.30  <0.30 ng/mL  TROPONIN I     Status: None   Collection Time    12/02/13  5:45 AM      Result Value Ref Range   Troponin I <0.30  <0.30 ng/mL  URINALYSIS, ROUTINE W REFLEX MICROSCOPIC     Status: Abnormal   Collection Time    12/02/13  3:34 PM      Result Value Ref Range   Color, Urine YELLOW  YELLOW   APPearance CLOUDY (*) CLEAR   Specific Gravity, Urine 1.008  1.005 - 1.030   pH 8.0  5.0 - 8.0   Glucose, UA NEGATIVE  NEGATIVE mg/dL   Hgb urine dipstick SMALL (*) NEGATIVE   Bilirubin Urine NEGATIVE  NEGATIVE   Ketones, ur NEGATIVE  NEGATIVE mg/dL   Protein, ur NEGATIVE  NEGATIVE mg/dL   Urobilinogen, UA 0.2  0.0 - 1.0 mg/dL   Nitrite NEGATIVE  NEGATIVE   Leukocytes, UA SMALL (*) NEGATIVE  URINE MICROSCOPIC-ADD ON     Status: Abnormal   Collection Time    12/02/13  3:34 PM      Result Value Ref Range   Squamous Epithelial / LPF MANY (*) RARE   WBC, UA 3-6  <3  WBC/hpf   RBC / HPF 0-2  <3 RBC/hpf   Bacteria, UA FEW (*) RARE   Urine-Other AMORPHOUS URATES/PHOSPHATES       Ct Abdomen Pelvis Wo Contrast  12/01/2013   CLINICAL DATA:  Left upper quadrant lymphadenopathy noted on CT of the chest; chest pain. History of ulcerative colitis.  EXAM: CT ABDOMEN AND PELVIS WITHOUT CONTRAST  TECHNIQUE: Multidetector CT imaging of the abdomen and pelvis was performed following the standard protocol without intravenous contrast.  COMPARISON:  CTA of the chest performed earlier today at 8:41 p.m.  FINDINGS: The visualized lung bases are clear. Prominent extrapleural fat is noted at the right lung base.  The liver and spleen are unremarkable in appearance. The patient is status post cholecystectomy, with clips noted along the gallbladder fossa. The pancreas and adrenal glands are unremarkable.  Contrast is seen filling the renal calyces, limiting evaluation for renal stones. A few small left-sided parapelvic renal cysts are seen. The kidneys are otherwise unremarkable in appearance. There is no evidence of hydronephrosis. No significant perinephric stranding is appreciated.  No free fluid is identified. The small bowel is unremarkable in appearance. The stomach is within normal limits. No acute vascular abnormalities are seen.  The appendix is grossly unremarkable in appearance; there is no evidence for appendicitis.  There is mild diffuse wall thickening and soft inflammation along the entirety of the colon. Associated pericecal lymphadenopathy is seen, and scattered small nodes are noted along the transverse colon adjacent to the pancreatic head. There are also prominent mesenteric nodes at the left upper quadrant, measuring up to 1.4 cm in short axis. This may reflect the patient's ulcerative colitis; the diffuse nature of these nodes suggests against malignancy, though it cannot be excluded.  The bladder is mildly distended and filled with contrast, and is unremarkable in  appearance. The  uterus is within normal limits. The ovaries are grossly symmetric, though difficult to fully assess. No suspicious adnexal masses are seen. No inguinal lymphadenopathy is seen.  No acute osseous abnormalities are identified. A large 2.8 cm mildly irregular sclerotic focus is noted at the left side of the sacrum; this may reflect a bone island, though a sclerotic mass cannot be entirely excluded. Bone scan could be considered for further evaluation.  IMPRESSION: 1. Mild diffuse wall thickening and soft tissue inflammation along the entirety of the colon, with associated scattered lymphadenopathy along the course of the colon. Prominent mesenteric nodes seen at the left upper quadrant, measuring up to 1.4 cm in short axis. Findings may reflect the patient's ulcerative colitis; the diffuse nature of these nodes suggests against malignancy, though it cannot be excluded. Would suggest correlation with colonoscopy, after resolution of the patient's acute exacerbation of ulcerative colitis. 2. 2.8 cm mildly irregular sclerotic focus at the left side of the sacrum; this may simply reflect a bone island, though a sclerotic mass cannot be entirely excluded. Bone scan could be considered for further evaluation, as deemed clinically appropriate. 3. Few small left-sided renal parapelvic cysts seen.   Electronically Signed   By: Roanna Raider M.D.   On: 12/01/2013 21:34   Dg Chest 2 View  12/01/2013   CLINICAL DATA:  Chest pain and shortness of breath  EXAM: CHEST  2 VIEW  COMPARISON:  None.  FINDINGS: The heart size and mediastinal contours are within normal limits. Both lungs are clear. The visualized skeletal structures are unremarkable.  IMPRESSION: No active cardiopulmonary disease.   Electronically Signed   By: Ruel Favors M.D.   On: 12/01/2013 15:31   Ct Angio Chest Pe W/cm &/or Wo Cm  12/01/2013   ADDENDUM REPORT: 12/01/2013 18:14  ADDENDUM: There are multiple nodules in the thyroid gland with the  largest being 16 mm. If the thyroid gland has not been previously assessed, thyroid ultrasound is recommended on an elective basis for further evaluation.   Electronically Signed   By: Geanie Cooley M.D.   On: 12/01/2013 18:14   12/01/2013   CLINICAL DATA:  Chest pain and tachycardia.  Shortness of breath.  EXAM: CT ANGIOGRAPHY CHEST WITH CONTRAST  TECHNIQUE: Multidetector CT imaging of the chest was performed using the standard protocol during bolus administration of intravenous contrast. Multiplanar CT image reconstructions and MIPs were obtained to evaluate the vascular anatomy.  CONTRAST:  65mL OMNIPAQUE IOHEXOL 300 MG/ML  SOLN  COMPARISON:  Chest x-ray dated 12/01/2013  FINDINGS: There are no pulmonary emboli. The lungs are clear. No hilar or mediastinal adenopathy. Heart size is normal. 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 This has a benign appearance.  There are multiple small soft tissue nodules in the left upper quadrant of the abdomen. I think these represent mesenteric adenopathy rather than accessory spleens. There is 1 nodule near the splenic hilum that measures 11 mm in diameter on image number 89 of series 4 which may be an accessory spleen.  The rest of the visualized portion of the abdomen appears normal.  Review of the MIP images confirms the above findings.  IMPRESSION: 1. No pulmonary emboli or other acute abnormalities in the chest. 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 2. Suggestion of mesenteric adenopathy in the left upper quadrant. I recommend CT scan of the abdomen for  further evaluation.  Electronically Signed: By: Geanie CooleyJim  Maxwell M.D. On: 12/01/2013 18:04    ROS:  As stated above in the HPI otherwise negative.  Blood pressure 105/64, pulse 108, temperature 100.3 F (37.9 C), temperature source Oral, resp. rate 20, height 5\' 6"  (1.676 m), weight 156 lb 3.2 oz  (70.852 kg), last menstrual period 11/11/2013, SpO2 100.00%.    PE: Gen: NAD, Alert and Oriented HEENT:  [place]/AT, EOMI Neck: Supple, no LAD Lungs: CTA Bilaterally CV: RRR without M/G/R ABM: Soft, NTND, +BS Ext: No C/C/E  Assessment/Plan: 1) Uncontrolled UC. 2) Symptomatic anemia.   I am awaiting her TMPT levels to come back in order to initiate treatment.  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  Also, it will take time for AZA to become effective.  Plan: 1) Supportive care at this time. 2) Agree with IV iron infusion.  Anaysia Germer D 12/02/2013, 5:46 PM

## 2013-12-02 NOTE — Progress Notes (Signed)
UR completed. Patient changed to inpatient- requiring IVF @ 75cc/hr and IV iron for hemoglobin of 6.6

## 2013-12-02 NOTE — Consult Note (Signed)
CARDIOLOGY CONSULT NOTE  Patient ID: Kelli Whitaker MRN: [id number] DOB/AGE: November 06, 1965 48 y.o.  Admit date: 12/01/2013  Primary Cardiologist: New Reason for Consultation: Chest pain  HPI:  48 yo with history of ulcerative colitis came to the ER yesterday because of sharp chest pain.  This started on Saturday night and occurred on and off until Sunday night.  No definite trigger.  When the pain would come, it would last for a few minutes.  The pain was pleuritic and central. She also has been dyspneic with walking for the last week or so.  She has noted blood in stool and has had some abdominal discomfort.  This morning, she has no chest pain.   There were no acute findings on ECG.  CTA chest did not show a PE or other lung abnormality.  Cardiac enzymes were negative.  Of note, her hemoglobin was 6.6.   Review of systems complete and found to be negative unless listed above in HPI  Past Medical History: 1. Ulcerative colitis: Followed by Dr Elnoria HowardHung 2. H/o cholecystectomy  FH: No family history of premature CAD.  History   Social History  . Marital Status: Legally Separated    Spouse Name: N/A    Number of Children: N/A  . Years of Education: N/A   Occupational History  . Not on file.   Social History Main Topics  . Smoking status: Never Smoker   . Smokeless tobacco: Never Used  . Alcohol Use: No  . Drug Use: No  . Sexual Activity: Not on file   Other Topics Concern  . Not on file   Social History Narrative  . No narrative on file     Prescriptions prior to admission  Medication Sig Dispense Refill  . Omega-3 Fatty Acids (FISH OIL) 1000 MG CAPS Take 2 capsules by mouth daily.      . Probiotic Product (PROBIOTIC DAILY PO) Take 1 tablet by mouth daily.        Physical exam Blood pressure 101/70, pulse 97, temperature 99.5 F (37.5 C), temperature source Oral, resp. rate 16, height 5\' 6"  (1.676 m), weight 70.852 kg (156 lb 3.2 oz), last menstrual period  11/11/2013, SpO2 100.00%. General: NAD Neck: No JVD, no thyromegaly or thyroid nodule.  Lungs: Clear to auscultation bilaterally with normal respiratory effort. CV: Nondisplaced PMI.  Heart regular S1/S2, no S3/S4, no murmur.  No peripheral edema.  No carotid bruit.  Normal pedal pulses.  Abdomen: Soft, mild diffuse tenderness, no hepatosplenomegaly, no distention.  Skin: Intact without lesions or rashes.  Neurologic: Alert and oriented x 3.  Psych: Normal affect. Extremities: No clubbing or cyanosis.  HEENT: Normal.   Labs:   Lab Results  Component Value Date   WBC 14.2* 12/02/2013   HGB 6.6* 12/02/2013   HCT 20.8* 12/02/2013   MCV 76.2* 12/02/2013   PLT 694* 12/02/2013    Recent Labs Lab 12/02/13 0009  NA 136*  K 3.4*  CL 98  CO2 27  BUN 5*  CREATININE 0.65  CALCIUM 8.3*  PROT 6.5  BILITOT <0.2*  ALKPHOS 49  ALT 7  AST 10  GLUCOSE 125*   Lab Results  Component Value Date   TROPONINI <0.30 12/02/2013     Radiology: - CTA chest: No PE, no significant lung abnormality  EKG: NSR, small suspect insignificant inferior Qs  ASSESSMENT AND PLAN:   48 yo with history of ulcerative colitis came to the ER yesterday because of sharp pleuritic chest  pain. 1. Chest pain: Atypical, pleuritic, now resolved.  CEs negative and ECG unremarkable.  CTA chest did not show PE or other abnormality.  Suspect noncardiac.  Will arrange for echocardiogram but would not do other workup at this time.   2. Exertional dyspnea: Patient has profound anemia with hemoglobin 6.6.  Suspect this is the cause of her dyspnea.   3. GI: Periodic blood in stool, history of ulcerative colitis, hemoglobin 6.6.  Per primary service.   Marca Ancona 12/02/2013 8:57 AM

## 2013-12-02 NOTE — Progress Notes (Signed)
[facility] PROGRESS NOTE   Kelli Whitaker WUJ:[id number] DOB: 1966-04-29 DOA: 12/01/2013 PCP: Jackie Plum, MD  HPI/Subjective: Denies any chest pain, does not have any bloody bowel movements.  Assessment/Plan: Principal Problem:   Chest pain Active Problems:   Ulcerative colitis   Anemia   Chest pain -Atypical chest pain, cardiology consulted. -3 sets of cardiac enzymes ruled out acute carcinoma. -Patient symptoms could be secondary to profound anemia.  Anemia -Acute on chronic blood loss anemia likely secondary to GI bleed from ulcerative colitis. -Presented with hemoglobin of 7.0, hemoglobin today is 6.6. -Patient is Jehov[employer]'s Witness and she refused blood transfusion. -I will try to minimize blood draws, give IV iron. Try to control the bleeding.  Ulcerative colitis -Patient says she has 10-15 loose bowel movement per day. -Last bowel movement was negative for fecal occult blood. -To notify Dr. Audley Hose to evaluate the patient. Rule out concurrent C. difficile infection.  Mesenteric adenopathy -CT scan of abdomen showed left upper quadrant mesenteric adenopathy. -Likely reactive, malignancy is less likely per report.  Code Status: Full code Family Communication: Plan discussed with the patient. Disposition Plan: Remains inpatient   Consultants:  Gastroenterology  Procedures:  None  Antibiotics:  None   Objective: Filed Vitals:   12/02/13 0500  BP: 101/70  Pulse: 97  Temp: 99.5 F (37.5 C)  Resp: 16   No intake or output data in the 24 hours ending 12/02/13 0922 Filed Weights   12/01/13 2139  Weight: 70.852 kg (156 lb 3.2 oz)    Exam: General: Alert and awake, oriented x3, not in any acute distress. HEENT: anicteric sclera, pupils reactive to light and accommodation, EOMI CVS: S1-S2 clear, no murmur rubs or gallops Chest: clear to auscultation bilaterally, no wheezing, rales or rhonchi Abdomen: soft nontender, nondistended, normal  bowel sounds, no organomegaly Extremities: no cyanosis, clubbing or edema noted bilaterally Neuro: Cranial nerves II-XII intact, no focal neurological deficits  Data Reviewed: Basic Metabolic Panel:  Recent Labs Lab 12/01/13 1555 12/02/13 0009  NA 137 136*  K 3.8 3.4*  CL 98 98  CO2 26 27  GLUCOSE 82 125*  BUN 6 5*  CREATININE 0.63 0.65  CALCIUM 8.6 8.3*   Liver Function Tests:  Recent Labs Lab 12/01/13 1555 12/02/13 0009  AST 11 10  ALT 7 7  ALKPHOS 54 49  BILITOT <0.2* <0.2*  PROT 7.0 6.5  ALBUMIN 2.5* 2.3*    Recent Labs Lab 12/01/13 1555  LIPASE 37   No results found for this basename: AMMONIA,  in the last 168 hours CBC:  Recent Labs Lab 12/01/13 1555 12/02/13 0009  WBC 13.0* 14.2*  NEUTROABS 9.7*  --   HGB 7.0* 6.6*  HCT 22.3* 20.8*  MCV 76.6* 76.2*  PLT 767* 694*   Cardiac Enzymes:  Recent Labs Lab 12/01/13 1555 12/01/13 1800 12/02/13 0009 12/02/13 0545  TROPONINI <0.30 <0.30 <0.30 <0.30   BNP (last 3 results)  Recent Labs  12/01/13 1555  PROBNP 90.7   CBG: No results found for this basename: GLUCAP,  in the last 168 hours  Micro No results found for this or any previous visit (from the past 240 hour(s)).   Studies: Ct Abdomen Pelvis Wo Contrast  12/01/2013   CLINICAL DATA:  Left upper quadrant lymphadenopathy noted on CT of the chest; chest pain. History of ulcerative colitis.  EXAM: CT ABDOMEN AND PELVIS WITHOUT CONTRAST  TECHNIQUE: Multidetector CT imaging of the abdomen and pelvis was performed following the standard protocol without intravenous contrast.  COMPARISON:  CTA of the chest performed earlier today at 8:41 p.m.  FINDINGS: The visualized lung bases are clear. Prominent extrapleural fat is noted at the right lung base.  The liver and spleen are unremarkable in appearance. The patient is status post cholecystectomy, with clips noted along the gallbladder fossa. The pancreas and adrenal glands are unremarkable.  Contrast  is seen filling the renal calyces, limiting evaluation for renal stones. A few small left-sided parapelvic renal cysts are seen. The kidneys are otherwise unremarkable in appearance. There is no evidence of hydronephrosis. No significant perinephric stranding is appreciated.  No free fluid is identified. The small bowel is unremarkable in appearance. The stomach is within normal limits. No acute vascular abnormalities are seen.  The appendix is grossly unremarkable in appearance; there is no evidence for appendicitis.  There is mild diffuse wall thickening and soft inflammation along the entirety of the colon. Associated pericecal lymphadenopathy is seen, and scattered small nodes are noted along the transverse colon adjacent to the pancreatic head. There are also prominent mesenteric nodes at the left upper quadrant, measuring up to 1.4 cm in short axis. This may reflect the patient's ulcerative colitis; the diffuse nature of these nodes suggests against malignancy, though it cannot be excluded.  The bladder is mildly distended and filled with contrast, and is unremarkable in appearance. The uterus is within normal limits. The ovaries are grossly symmetric, though difficult to fully assess. No suspicious adnexal masses are seen. No inguinal lymphadenopathy is seen.  No acute osseous abnormalities are identified. A large 2.8 cm mildly irregular sclerotic focus is noted at the left side of the sacrum; this may reflect a bone island, though a sclerotic mass cannot be entirely excluded. Bone scan could be considered for further evaluation.  IMPRESSION: 1. Mild diffuse wall thickening and soft tissue inflammation along the entirety of the colon, with associated scattered lymphadenopathy along the course of the colon. Prominent mesenteric nodes seen at the left upper quadrant, measuring up to 1.4 cm in short axis. Findings may reflect the patient's ulcerative colitis; the diffuse nature of these nodes suggests against  malignancy, though it cannot be excluded. Would suggest correlation with colonoscopy, after resolution of the patient's acute exacerbation of ulcerative colitis. 2. 2.8 cm mildly irregular sclerotic focus at the left side of the sacrum; this may simply reflect a bone island, though a sclerotic mass cannot be entirely excluded. Bone scan could be considered for further evaluation, as deemed clinically appropriate. 3. Few small left-sided renal parapelvic cysts seen.   Electronically Signed   By: Roanna Raider M.D.   On: 12/01/2013 21:34   Dg Chest 2 View  12/01/2013   CLINICAL DATA:  Chest pain and shortness of breath  EXAM: CHEST  2 VIEW  COMPARISON:  None.  FINDINGS: The heart size and mediastinal contours are within normal limits. Both lungs are clear. The visualized skeletal structures are unremarkable.  IMPRESSION: No active cardiopulmonary disease.   Electronically Signed   By: Ruel Favors M.D.   On: 12/01/2013 15:31   Ct Angio Chest Pe W/cm &/or Wo Cm  12/01/2013   ADDENDUM REPORT: 12/01/2013 18:14  ADDENDUM: There are multiple nodules in the thyroid gland with the largest being 16 mm. If the thyroid gland has not been previously assessed, thyroid ultrasound is recommended on an elective basis for further evaluation.   Electronically Signed   By: Geanie Cooley M.D.   On: 12/01/2013 18:14   12/01/2013   CLINICAL DATA:  Chest pain and tachycardia.  Shortness of breath.  EXAM: CT ANGIOGRAPHY CHEST WITH CONTRAST  TECHNIQUE: Multidetector CT imaging of the chest was performed using the standard protocol during bolus administration of intravenous contrast. Multiplanar CT image reconstructions and MIPs were obtained to evaluate the vascular anatomy.  CONTRAST:  65mL OMNIPAQUE IOHEXOL 300 MG/ML  SOLN  COMPARISON:  Chest x-ray dated 12/01/2013  FINDINGS: There are no pulmonary emboli. The lungs are clear. No hilar or mediastinal adenopathy. Heart size is normal. 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 This has a benign appearance.  There are multiple small soft tissue nodules in the left upper quadrant of the abdomen. I think these represent mesenteric adenopathy rather than accessory spleens. There is 1 nodule near the splenic hilum that measures 11 mm in diameter on image number 89 of series 4 which may be an accessory spleen.  The rest of the visualized portion of the abdomen appears normal.  Review of the MIP images confirms the above findings.  IMPRESSION: 1. No pulmonary emboli or other acute abnormalities in the chest. 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 2. Suggestion of mesenteric adenopathy in the left upper quadrant. I recommend CT scan of the abdomen for further evaluation.  Electronically Signed: By: Geanie CooleyJim  Maxwell M.D. On: 12/01/2013 18:04    Scheduled Meds:  Continuous Infusions: . sodium chloride 75 mL/hr at 12/01/13 2335       Time spent: 35 minutes    [facility] Mental Hlth InstituteELMAHI,Luceil Herrin A  [facility] Pager [phone] If 7PM-7AM, please contact night-coverage at [email or web address], password [email or web address] 12/02/2013, 9:22 AM  LOS: 1 day

## 2013-12-02 NOTE — Progress Notes (Signed)
  Echocardiogram 2D Echocardiogram has been performed.  Kelli Whitaker, Kelli Whitaker 12/02/2013, 12:56 PM

## 2013-12-02 NOTE — Progress Notes (Signed)
R. Reidler  on call  and notified of HGB 6.6.  Per MD pt is refusing blood transfusion at this time. No new orders received at this time. Will continue to monitor. Dierdre HighmanHall, Serinity Ware Marie, RN

## 2013-12-03 DIAGNOSIS — Z531 Procedure and treatment not carried out because of patient's decision for reasons of belief and group pressure: Secondary | ICD-10-CM

## 2013-12-03 LAB — CLOSTRIDIUM DIFFICILE BY PCR: Toxigenic C. Difficile by PCR: NEGATIVE

## 2013-12-03 NOTE — Progress Notes (Signed)
Subjective: No acute events.  Feeling better.  Objective: Vital signs in last 24 hours: Temp:  [98.6 F (37 C)-98.7 F (37.1 C)] 98.6 F (37 C) (02/24 0414) Pulse Rate:  [102] 102 (02/23 2131) Resp:  [20] 20 (02/24 0414) BP: (101-107)/(66-70) 107/70 mmHg (02/24 0414) SpO2:  [96 %-97 %] 96 % (02/24 0414) Last BM Date: 12/02/13  Intake/Output from previous day: 02/23 0701 - 02/24 0700 In: 225 [I.V.:225] Out: -  Intake/Output this shift: Total I/O In: 480 [P.O.:480] Out: -   General appearance: alert and no distress GI: soft, non-tender; bowel sounds normal; no masses,  no organomegaly  Lab Results:  Recent Labs  12/01/13 1555 12/02/13 0009  WBC 13.0* 14.2*  HGB 7.0* 6.6*  HCT 22.3* 20.8*  PLT 767* 694*   BMET  Recent Labs  12/01/13 1555 12/02/13 0009  NA 137 136*  K 3.8 3.4*  CL 98 98  CO2 26 27  GLUCOSE 82 125*  BUN 6 5*  CREATININE 0.63 0.65  CALCIUM 8.6 8.3*   LFT  Recent Labs  12/01/13 1555 12/02/13 0009  PROT 7.0 6.5  ALBUMIN 2.5* 2.3*  AST 11 10  ALT 7 7  ALKPHOS 54 49  BILITOT <0.2* <0.2*  BILIDIR <0.2  --   IBILI NOT CALCULATED  --    PT/INR No results found for this basename: LABPROT, INR,  in the last 72 hours Hepatitis Panel No results found for this basename: HEPBSAG, HCVAB, HEPAIGM, HEPBIGM,  in the last 72 hours C-Diff No results found for this basename: CDIFFTOX,  in the last 72 hours Fecal Lactopherrin No results found for this basename: FECLLACTOFRN,  in the last 72 hours  Studies/Results: Ct Abdomen Pelvis Wo Contrast  12/01/2013   CLINICAL DATA:  Left upper quadrant lymphadenopathy noted on CT of the chest; chest pain. History of ulcerative colitis.  EXAM: CT ABDOMEN AND PELVIS WITHOUT CONTRAST  TECHNIQUE: Multidetector CT imaging of the abdomen and pelvis was performed following the standard protocol without intravenous contrast.  COMPARISON:  CTA of the chest performed earlier today at 8:41 p.m.  FINDINGS: The  visualized lung bases are clear. Prominent extrapleural fat is noted at the right lung base.  The liver and spleen are unremarkable in appearance. The patient is status post cholecystectomy, with clips noted along the gallbladder fossa. The pancreas and adrenal glands are unremarkable.  Contrast is seen filling the renal calyces, limiting evaluation for renal stones. A few small left-sided parapelvic renal cysts are seen. The kidneys are otherwise unremarkable in appearance. There is no evidence of hydronephrosis. No significant perinephric stranding is appreciated.  No free fluid is identified. The small bowel is unremarkable in appearance. The stomach is within normal limits. No acute vascular abnormalities are seen.  The appendix is grossly unremarkable in appearance; there is no evidence for appendicitis.  There is mild diffuse wall thickening and soft inflammation along the entirety of the colon. Associated pericecal lymphadenopathy is seen, and scattered small nodes are noted along the transverse colon adjacent to the pancreatic head. There are also prominent mesenteric nodes at the left upper quadrant, measuring up to 1.4 cm in short axis. This may reflect the patient's ulcerative colitis; the diffuse nature of these nodes suggests against malignancy, though it cannot be excluded.  The bladder is mildly distended and filled with contrast, and is unremarkable in appearance. The uterus is within normal limits. The ovaries are grossly symmetric, though difficult to fully assess. No suspicious adnexal masses are seen.  No inguinal lymphadenopathy is seen.  No acute osseous abnormalities are identified. A large 2.8 cm mildly irregular sclerotic focus is noted at the left side of the sacrum; this may reflect a bone island, though a sclerotic mass cannot be entirely excluded. Bone scan could be considered for further evaluation.  IMPRESSION: 1. Mild diffuse wall thickening and soft tissue inflammation along the  entirety of the colon, with associated scattered lymphadenopathy along the course of the colon. Prominent mesenteric nodes seen at the left upper quadrant, measuring up to 1.4 cm in short axis. Findings may reflect the patient's ulcerative colitis; the diffuse nature of these nodes suggests against malignancy, though it cannot be excluded. Would suggest correlation with colonoscopy, after resolution of the patient's acute exacerbation of ulcerative colitis. 2. 2.8 cm mildly irregular sclerotic focus at the left side of the sacrum; this may simply reflect a bone island, though a sclerotic mass cannot be entirely excluded. Bone scan could be considered for further evaluation, as deemed clinically appropriate. 3. Few small left-sided renal parapelvic cysts seen.   Electronically Signed   By: Roanna Raider M.D.   On: 12/01/2013 21:34   Dg Chest 2 View  12/01/2013   CLINICAL DATA:  Chest pain and shortness of breath  EXAM: CHEST  2 VIEW  COMPARISON:  None.  FINDINGS: The heart size and mediastinal contours are within normal limits. Both lungs are clear. The visualized skeletal structures are unremarkable.  IMPRESSION: No active cardiopulmonary disease.   Electronically Signed   By: Ruel Favors M.D.   On: 12/01/2013 15:31   Ct Angio Chest Pe W/cm &/or Wo Cm  12/01/2013   ADDENDUM REPORT: 12/01/2013 18:14  ADDENDUM: There are multiple nodules in the thyroid gland with the largest being 16 mm. If the thyroid gland has not been previously assessed, thyroid ultrasound is recommended on an elective basis for further evaluation.   Electronically Signed   By: Geanie Cooley M.D.   On: 12/01/2013 18:14   12/01/2013   CLINICAL DATA:  Chest pain and tachycardia.  Shortness of breath.  EXAM: CT ANGIOGRAPHY CHEST WITH CONTRAST  TECHNIQUE: Multidetector CT imaging of the chest was performed using the standard protocol during bolus administration of intravenous contrast. Multiplanar CT image reconstructions and MIPs were  obtained to evaluate the vascular anatomy.  CONTRAST:  65mL OMNIPAQUE IOHEXOL 300 MG/ML  SOLN  COMPARISON:  Chest x-ray dated 12/01/2013  FINDINGS: There are no pulmonary emboli. The lungs are clear. No hilar or mediastinal adenopathy. Heart size is normal. 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 This has a benign appearance.  There are multiple small soft tissue nodules in the left upper quadrant of the abdomen. I think these represent mesenteric adenopathy rather than accessory spleens. There is 1 nodule near the splenic hilum that measures 11 mm in diameter on image number 89 of series 4 which may be an accessory spleen.  The rest of the visualized portion of the abdomen appears normal.  Review of the MIP images confirms the above findings.  IMPRESSION: 1. No pulmonary emboli or other acute abnormalities in the chest. 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 2. Suggestion of mesenteric adenopathy in the left upper quadrant. I recommend CT scan of the abdomen for further evaluation.  Electronically Signed: By: Geanie Cooley M.D. On: 12/01/2013 18:04    Medications:  Scheduled:  Continuous: . sodium chloride 75 mL/hr at 12/02/13 2130    Assessment/Plan: 1) Uncontrolled UC. 2) Severe iron deficiency anemia.   Clinically she stable.  Tolerated the iron infusion without any difficulty.    Plan: 1) Continue with supportive care. 2) I am hoping to have the TPMT report from the office today or tomorrow.  Hopefully AZA therapy can be initiated.   LOS: 2 days   Marquette Blodgett D 12/03/2013, 2:01 PM

## 2013-12-03 NOTE — Progress Notes (Signed)
[facility] PROGRESS NOTE   Kelli Whitaker XBM:[id number] DOB: 1965/10/29 DOA: 12/01/2013 PCP: Jackie Plum, MD  HPI/Subjective: Denies any chest pain, no much of complaints. Had about 10 bloody bowel movements since yesterday morning.  Assessment/Plan: Principal Problem:   Chest pain Active Problems:   Ulcerative colitis   Anemia   Refusal of blood transfusions as patient is Jehovah's Witness   Chest pain -Atypical chest pain, cardiology consulted. -3 sets of cardiac enzymes ruled out acute carcinoma. -Patient symptoms could be secondary to the anemia. -Unfortunately not a lot can be done at this point, likely she will have recurrent chest pain with exertion secondary to anemia.  Anemia -Acute on chronic blood loss anemia likely secondary to GI bleed from ulcerative colitis. -Presented with hemoglobin of 7.0, hemoglobin today is 6.6. -Patient is Jehovah's Witness and she refused blood transfusion. -I will minimize blood draws. Try to control the bleeding. -Patient received infusion of 1050 milligrams of InFeD.  Ulcerative colitis -Patient says she has 10-15 loose bowel movement per day. -Last bowel movement was negative for fecal occult blood. -To notify Dr. Elnoria Howard to evaluate the patient. Stool studies negative for concurrent C. difficile infection.  Mesenteric adenopathy -CT scan of abdomen showed left upper quadrant mesenteric adenopathy. -Likely reactive, malignancy is less likely per report.  Jehovah's Witness -Patient refuses blood transfusion, blood draws minimized. -Received infusion of IV iron. -GI waiting on TM PT level to come back in order to initiate treatment. -If that does not expected to come back in the future patient can be discharged home and followup as outpatient.  Code Status: Full code Family Communication: Plan discussed with the patient. Disposition Plan: Remains  inpatient   Consultants:  Gastroenterology  Procedures:  None  Antibiotics:  None   Objective: Filed Vitals:   12/03/13 0414  BP: 107/70  Pulse:   Temp: 98.6 F (37 C)  Resp: 20   No intake or output data in the 24 hours ending 12/03/13 1200 Filed Weights   12/01/13 2139  Weight: 70.852 kg (156 lb 3.2 oz)    Exam: General: Alert and awake, oriented x3, not in any acute distress. HEENT: anicteric sclera, pupils reactive to light and accommodation, EOMI CVS: S1-S2 clear, no murmur rubs or gallops Chest: clear to auscultation bilaterally, no wheezing, rales or rhonchi Abdomen: soft nontender, nondistended, normal bowel sounds, no organomegaly Extremities: no cyanosis, clubbing or edema noted bilaterally Neuro: Cranial nerves II-XII intact, no focal neurological deficits  Data Reviewed: Basic Metabolic Panel:  Recent Labs Lab 12/01/13 1555 12/02/13 0009  NA 137 136*  K 3.8 3.4*  CL 98 98  CO2 26 27  GLUCOSE 82 125*  BUN 6 5*  CREATININE 0.63 0.65  CALCIUM 8.6 8.3*   Liver Function Tests:  Recent Labs Lab 12/01/13 1555 12/02/13 0009  AST 11 10  ALT 7 7  ALKPHOS 54 49  BILITOT <0.2* <0.2*  PROT 7.0 6.5  ALBUMIN 2.5* 2.3*    Recent Labs Lab 12/01/13 1555  LIPASE 37   No results found for this basename: AMMONIA,  in the last 168 hours CBC:  Recent Labs Lab 12/01/13 1555 12/02/13 0009  WBC 13.0* 14.2*  NEUTROABS 9.7*  --   HGB 7.0* 6.6*  HCT 22.3* 20.8*  MCV 76.6* 76.2*  PLT 767* 694*   Cardiac Enzymes:  Recent Labs Lab 12/01/13 1555 12/01/13 1800 12/02/13 0009 12/02/13 0545  TROPONINI <0.30 <0.30 <0.30 <0.30   BNP (last 3 results)  Recent Labs  12/01/13 1555  PROBNP 90.7   CBG: No results found for this basename: GLUCAP,  in the last 168 hours  Micro Recent Results (from the past 240 hour(s))  CLOSTRIDIUM DIFFICILE BY PCR     Status: None   Collection Time    12/02/13  4:39 PM      Result Value Ref Range Status    C difficile by pcr NEGATIVE  NEGATIVE Final     Studies: Ct Abdomen Pelvis Wo Contrast  12/01/2013   CLINICAL DATA:  Left upper quadrant lymphadenopathy noted on CT of the chest; chest pain. History of ulcerative colitis.  EXAM: CT ABDOMEN AND PELVIS WITHOUT CONTRAST  TECHNIQUE: Multidetector CT imaging of the abdomen and pelvis was performed following the standard protocol without intravenous contrast.  COMPARISON:  CTA of the chest performed earlier today at 8:41 p.m.  FINDINGS: The visualized lung bases are clear. Prominent extrapleural fat is noted at the right lung base.  The liver and spleen are unremarkable in appearance. The patient is status post cholecystectomy, with clips noted along the gallbladder fossa. The pancreas and adrenal glands are unremarkable.  Contrast is seen filling the renal calyces, limiting evaluation for renal stones. A few small left-sided parapelvic renal cysts are seen. The kidneys are otherwise unremarkable in appearance. There is no evidence of hydronephrosis. No significant perinephric stranding is appreciated.  No free fluid is identified. The small bowel is unremarkable in appearance. The stomach is within normal limits. No acute vascular abnormalities are seen.  The appendix is grossly unremarkable in appearance; there is no evidence for appendicitis.  There is mild diffuse wall thickening and soft inflammation along the entirety of the colon. Associated pericecal lymphadenopathy is seen, and scattered small nodes are noted along the transverse colon adjacent to the pancreatic head. There are also prominent mesenteric nodes at the left upper quadrant, measuring up to 1.4 cm in short axis. This may reflect the patient's ulcerative colitis; the diffuse nature of these nodes suggests against malignancy, though it cannot be excluded.  The bladder is mildly distended and filled with contrast, and is unremarkable in appearance. The uterus is within normal limits. The ovaries  are grossly symmetric, though difficult to fully assess. No suspicious adnexal masses are seen. No inguinal lymphadenopathy is seen.  No acute osseous abnormalities are identified. A large 2.8 cm mildly irregular sclerotic focus is noted at the left side of the sacrum; this may reflect a bone island, though a sclerotic mass cannot be entirely excluded. Bone scan could be considered for further evaluation.  IMPRESSION: 1. Mild diffuse wall thickening and soft tissue inflammation along the entirety of the colon, with associated scattered lymphadenopathy along the course of the colon. Prominent mesenteric nodes seen at the left upper quadrant, measuring up to 1.4 cm in short axis. Findings may reflect the patient's ulcerative colitis; the diffuse nature of these nodes suggests against malignancy, though it cannot be excluded. Would suggest correlation with colonoscopy, after resolution of the patient's acute exacerbation of ulcerative colitis. 2. 2.8 cm mildly irregular sclerotic focus at the left side of the sacrum; this may simply reflect a bone island, though a sclerotic mass cannot be entirely excluded. Bone scan could be considered for further evaluation, as deemed clinically appropriate. 3. Few small left-sided renal parapelvic cysts seen.   Electronically Signed   By: Roanna RaiderJeffery  Chang M.D.   On: 12/01/2013 21:34   Dg Chest 2 View  12/01/2013   CLINICAL DATA:  Chest pain and shortness of breath  EXAM: CHEST  2 VIEW  COMPARISON:  None.  FINDINGS: The heart size and mediastinal contours are within normal limits. Both lungs are clear. The visualized skeletal structures are unremarkable.  IMPRESSION: No active cardiopulmonary disease.   Electronically Signed   By: Ruel Favors M.D.   On: 12/01/2013 15:31   Ct Angio Chest Pe W/cm &/or Wo Cm  12/01/2013   ADDENDUM REPORT: 12/01/2013 18:14  ADDENDUM: There are multiple nodules in the thyroid gland with the largest being 16 mm. If the thyroid gland has not been  previously assessed, thyroid ultrasound is recommended on an elective basis for further evaluation.   Electronically Signed   By: Geanie Cooley M.D.   On: 12/01/2013 18:14   12/01/2013   CLINICAL DATA:  Chest pain and tachycardia.  Shortness of breath.  EXAM: CT ANGIOGRAPHY CHEST WITH CONTRAST  TECHNIQUE: Multidetector CT imaging of the chest was performed using the standard protocol during bolus administration of intravenous contrast. Multiplanar CT image reconstructions and MIPs were obtained to evaluate the vascular anatomy.  CONTRAST:  65mL OMNIPAQUE IOHEXOL 300 MG/ML  SOLN  COMPARISON:  Chest x-ray dated 12/01/2013  FINDINGS: There are no pulmonary emboli. The lungs are clear. No hilar or mediastinal adenopathy. Heart size is normal. 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 This has a benign appearance.  There are multiple small soft tissue nodules in the left upper quadrant of the abdomen. I think these represent mesenteric adenopathy rather than accessory spleens. There is 1 nodule near the splenic hilum that measures 11 mm in diameter on image number 89 of series 4 which may be an accessory spleen.  The rest of the visualized portion of the abdomen appears normal.  Review of the MIP images confirms the above findings.  IMPRESSION: 1. No pulmonary emboli or other acute abnormalities in the chest. 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 2. Suggestion of mesenteric adenopathy in the left upper quadrant. I recommend CT scan of the abdomen for further evaluation.  Electronically Signed: By: Geanie Cooley M.D. On: 12/01/2013 18:04    Scheduled Meds:  Continuous Infusions: . sodium chloride 75 mL/hr at 12/02/13 2130       Time spent: 35 minutes    [facility] A  [facility] Pager [phone] If 7PM-7AM, please contact night-coverage at [email or web address], password  [facility] 12/03/2013, 12:00 PM  LOS: 2 days

## 2013-12-03 NOTE — Progress Notes (Signed)
       Patient Name: Kelli Whitaker Date of Encounter: 12/03/2013    SUBJECTIVE: No cardiac complaints.  TELEMETRY:  NSR and ST Filed Vitals:   12/02/13 1400 12/02/13 1846 12/02/13 2131 12/03/13 0414  BP: 105/64  101/66 107/70  Pulse: 108  102   Temp: 100.3 F (37.9 C) 98.7 F (37.1 C) 98.6 F (37 C) 98.6 F (37 C)  TempSrc: Oral Axillary Axillary Axillary  Resp: 20  20 20   Height:      Weight:      SpO2: 100%  97% 96%    Intake/Output Summary (Last 24 hours) at 12/03/13 0945 Last data filed at 12/02/13 1000  Gross per 24 hour  Intake    225 ml  Output      0 ml  Net    225 ml    LABS: Basic Metabolic Panel:  Recent Labs  16/07/9601/22/15 1555 12/02/13 0009  NA 137 136*  K 3.8 3.4*  CL 98 98  CO2 26 27  GLUCOSE 82 125*  BUN 6 5*  CREATININE 0.63 0.65  CALCIUM 8.6 8.3*   CBC:  Recent Labs  12/01/13 1555 12/02/13 0009  WBC 13.0* 14.2*  NEUTROABS 9.7*  --   HGB 7.0* 6.6*  HCT 22.3* 20.8*  MCV 76.6* 76.2*  PLT 767* 694*   Cardiac Enzymes:  Recent Labs  12/01/13 1800 12/02/13 0009 12/02/13 0545  TROPONINI <0.30 <0.30 <0.30   BNP    Component Value Date/Time   PROBNP 90.7 12/01/2013 1555   ECHO: 12/02/13 Study Conclusions  - Left ventricle: The cavity size was normal. Wall thickness was normal. Systolic function was normal. The estimated ejection fraction was in the range of 60% to 65%. Wall motion was normal; there were no regional wall motion abnormalities. Doppler parameters are consistent with abnormal left ventricular relaxation (grade 1 diastolic dysfunction). - Aortic valve: There was no stenosis. - Mitral valve: No significant regurgitation. - Right ventricle: The cavity size was normal. Systolic function was normal. - Tricuspid valve: Peak RV-RA gradient: 24mm Hg (S). - Pulmonary arteries: PA peak pressure: 27mm Hg (S). - Inferior vena cava: The vessel was normal in size; the respirophasic diameter changes were in the normal  range (= 50%); findings are consistent with normal central venous pressure.   Radiology/Studies:  No new data  Physical Exam: Blood pressure 107/70, pulse 102, temperature 98.6 F (37 C), temperature source Axillary, resp. rate 20, height 5\' 6"  (1.676 m), weight 156 lb 3.2 oz (70.852 kg), last menstrual period 11/11/2013, SpO2 96.00%. Weight change:    No rub or gallop  ASSESSMENT:  No apparent cardiac issues. Symptoms related to anemia.  Plan:  Will sign off  Signed, Lesleigh NoeSMITH III,HENRY W 12/03/2013, 9:45 AM

## 2013-12-04 NOTE — Discharge Summary (Signed)
Physician Discharge Summary  Patient ID: Kelli Whitaker MRN: [id number] DOB/AGE: 50-Sep-1967 50 y.o.  Admit date: 12/01/2013 Discharge date: 12/04/2013  Primary Care Physician:  Jackie Plum, MD  Discharge Diagnoses:    . atypical Chest pain likely due to anemia  . Ulcerative colitis . acute on chronic Anemia due to GI bleed from ulcerative colitis Refusal of blood transfusion, Jehovah's Witness  Consults:  Gastroenterology, Dr. Elnoria Howard   Recommendations for Outpatient Follow-up:  Followup on TMPT levels per gastroenterology   Allergies:   Allergies  Allergen Reactions  . Prednisone Other (See Comments)    Abdominal pain      Discharge Medications:   Medication List         Fish Oil 1000 MG Caps  Take 2 capsules by mouth daily.     PROBIOTIC DAILY PO  Take 1 tablet by mouth daily.         Brief H and P: For complete details please refer to admission H and P, but in brief 48 year old female who has a past medical history of Ulcerative colitis presented to the ED chief complaint of chest pain, shortness of breath, palpitations. . As per patient she has been having intermittent chest in last night. Chest pain is stabbing in character, intermittent, gets worse on walking and movement. At this time pain is 2/10 in intensity. Also has been using palpitations lungs shortness of breath especially on walking. She denied any fever no nausea vomiting but admits to having diarrhea. She has a history of ulcerative colitis and usually has 10-15 bowel movements every day. Stool guaiac was negative today but patient had been passing blood in the stools over the past few days.  No dysuria urgency frequency of urination.  In the ED patient was found to have elevated d-dimer, CT angiogram which is negative for PE but shows left upper quadrant mesenteric adenopathy.Patient also found to be anemic with hemoglobin 7.0, patient refused blood transfusion due to religious reasons. Cardiac  enzymes was negative in ED, EKG shows normal sinus rhythm.   Hospital Course:  Chest pain -Atypical chest pain. Patient was ruled out for acute ACS negative cardiac enzymes. Cardiology was consulted and felt that this was noncardiac likely due to acute anemia. Patient had refused packed RBC transfusion due to religious reasons. Unfortunately not a lot can be done at this point, likely she will have recurrent chest pain with exertion secondary to anemia.   Anemia  -Acute on chronic blood loss anemia likely secondary to GI bleed from ulcerative colitis. Presented with hemoglobin of 7.0, hemoglobin trended down to 6.6. Patient is Jehovah's Witness and she refused blood transfusion. Patient received infusion of 1050 milligrams of InFeD.   Ulcerative colitis  stool studies was negative for C. Difficile. GI was consulted, patient was seen by Dr. Elnoria Howard. Patient has a known history of uncontrolled ulcerative colitis. She had not tolerated Uceris and Lialda.Her blood work for the TPMT is still pending at this time. Discussions for starting on an anti-TNF were made, but she is not ready to take that step. She will follow up with Dr. Elnoria Howard outpatient for further management.   Mesenteric adenopathy  -CT scan of abdomen showed left upper quadrant mesenteric adenopathy. Likely reactive, malignancy is less likely per report.   Jehovah's Witness  -Received infusion of IV iron.     Day of Discharge BP 114/66  Pulse 83  Temp(Src) 98.3 F (36.8 C) (Oral)  Resp 18  Ht 5\' 6"  (1.676 m)  Wt 70.761  kg (156 lb)  BMI 25.19 kg/m2  SpO2 100%  LMP 11/11/2013  Physical Exam: General: Alert and awake oriented x3 not in any acute distress. CVS: S1-S2 clear no murmur rubs or gallops Chest: clear to auscultation bilaterally, no wheezing rales or rhonchi Abdomen: soft nontender, nondistended, normal bowel sounds Extremities: no cyanosis, clubbing or edema noted bilaterally Neuro: Cranial nerves II-XII intact, no  focal neurological deficits   The results of significant diagnostics from this hospitalization (including imaging, microbiology, ancillary and laboratory) are listed below for reference.    LAB RESULTS: Basic Metabolic Panel:  Recent Labs Lab 12/01/13 1555 12/02/13 0009  NA 137 136*  K 3.8 3.4*  CL 98 98  CO2 26 27  GLUCOSE 82 125*  BUN 6 5*  CREATININE 0.63 0.65  CALCIUM 8.6 8.3*   Liver Function Tests:  Recent Labs Lab 12/01/13 1555 12/02/13 0009  AST 11 10  ALT 7 7  ALKPHOS 54 49  BILITOT <0.2* <0.2*  PROT 7.0 6.5  ALBUMIN 2.5* 2.3*    Recent Labs Lab 12/01/13 1555  LIPASE 37   No results found for this basename: AMMONIA,  in the last 168 hours CBC:  Recent Labs Lab 12/01/13 1555 12/02/13 0009  WBC 13.0* 14.2*  NEUTROABS 9.7*  --   HGB 7.0* 6.6*  HCT 22.3* 20.8*  MCV 76.6* 76.2*  PLT 767* 694*   Cardiac Enzymes:  Recent Labs Lab 12/02/13 0009 12/02/13 0545  TROPONINI <0.30 <0.30   BNP: No components found with this basename: POCBNP,  CBG: No results found for this basename: GLUCAP,  in the last 168 hours  Significant Diagnostic Studies:  Ct Abdomen Pelvis Wo Contrast  12/01/2013   CLINICAL DATA:  Left upper quadrant lymphadenopathy noted on CT of the chest; chest pain. History of ulcerative colitis.  EXAM: CT ABDOMEN AND PELVIS WITHOUT CONTRAST  TECHNIQUE: Multidetector CT imaging of the abdomen and pelvis was performed following the standard protocol without intravenous contrast.  COMPARISON:  CTA of the chest performed earlier today at 8:41 p.m.  FINDINGS: The visualized lung bases are clear. Prominent extrapleural fat is noted at the right lung base.  The liver and spleen are unremarkable in appearance. The patient is status post cholecystectomy, with clips noted along the gallbladder fossa. The pancreas and adrenal glands are unremarkable.  Contrast is seen filling the renal calyces, limiting evaluation for renal stones. A few small  left-sided parapelvic renal cysts are seen. The kidneys are otherwise unremarkable in appearance. There is no evidence of hydronephrosis. No significant perinephric stranding is appreciated.  No free fluid is identified. The small bowel is unremarkable in appearance. The stomach is within normal limits. No acute vascular abnormalities are seen.  The appendix is grossly unremarkable in appearance; there is no evidence for appendicitis.  There is mild diffuse wall thickening and soft inflammation along the entirety of the colon. Associated pericecal lymphadenopathy is seen, and scattered small nodes are noted along the transverse colon adjacent to the pancreatic head. There are also prominent mesenteric nodes at the left upper quadrant, measuring up to 1.4 cm in short axis. This may reflect the patient's ulcerative colitis; the diffuse nature of these nodes suggests against malignancy, though it cannot be excluded.  The bladder is mildly distended and filled with contrast, and is unremarkable in appearance. The uterus is within normal limits. The ovaries are grossly symmetric, though difficult to fully assess. No suspicious adnexal masses are seen. No inguinal lymphadenopathy is seen.  No acute  osseous abnormalities are identified. A large 2.8 cm mildly irregular sclerotic focus is noted at the left side of the sacrum; this may reflect a bone island, though a sclerotic mass cannot be entirely excluded. Bone scan could be considered for further evaluation.  IMPRESSION: 1. Mild diffuse wall thickening and soft tissue inflammation along the entirety of the colon, with associated scattered lymphadenopathy along the course of the colon. Prominent mesenteric nodes seen at the left upper quadrant, measuring up to 1.4 cm in short axis. Findings may reflect the patient's ulcerative colitis; the diffuse nature of these nodes suggests against malignancy, though it cannot be excluded. Would suggest correlation with colonoscopy,  after resolution of the patient's acute exacerbation of ulcerative colitis. 2. 2.8 cm mildly irregular sclerotic focus at the left side of the sacrum; this may simply reflect a bone island, though a sclerotic mass cannot be entirely excluded. Bone scan could be considered for further evaluation, as deemed clinically appropriate. 3. Few small left-sided renal parapelvic cysts seen.   Electronically Signed   By: Roanna Raider M.D.   On: 12/01/2013 21:34   Dg Chest 2 View  12/01/2013   CLINICAL DATA:  Chest pain and shortness of breath  EXAM: CHEST  2 VIEW  COMPARISON:  None.  FINDINGS: The heart size and mediastinal contours are within normal limits. Both lungs are clear. The visualized skeletal structures are unremarkable.  IMPRESSION: No active cardiopulmonary disease.   Electronically Signed   By: Ruel Favors M.D.   On: 12/01/2013 15:31   Ct Angio Chest Pe W/cm &/or Wo Cm  12/01/2013   ADDENDUM REPORT: 12/01/2013 18:14  ADDENDUM: There are multiple nodules in the thyroid gland with the largest being 16 mm. If the thyroid gland has not been previously assessed, thyroid ultrasound is recommended on an elective basis for further evaluation.   Electronically Signed   By: Geanie Cooley M.D.   On: 12/01/2013 18:14   12/01/2013   CLINICAL DATA:  Chest pain and tachycardia.  Shortness of breath.  EXAM: CT ANGIOGRAPHY CHEST WITH CONTRAST  TECHNIQUE: Multidetector CT imaging of the chest was performed using the standard protocol during bolus administration of intravenous contrast. Multiplanar CT image reconstructions and MIPs were obtained to evaluate the vascular anatomy.  CONTRAST:  65mL OMNIPAQUE IOHEXOL 300 MG/ML  SOLN  COMPARISON:  Chest x-ray dated 12/01/2013  FINDINGS: There are no pulmonary emboli. The lungs are clear. No hilar or mediastinal adenopathy. Heart size is normal. 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 This  has a benign appearance.  There are multiple small soft tissue nodules in the left upper quadrant of the abdomen. I think these represent mesenteric adenopathy rather than accessory spleens. There is 1 nodule near the splenic hilum that measures 11 mm in diameter on image number 89 of series 4 which may be an accessory spleen.  The rest of the visualized portion of the abdomen appears normal.  Review of the MIP images confirms the above findings.  IMPRESSION: 1. No pulmonary emboli or other acute abnormalities in the chest. 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 2. Suggestion of mesenteric adenopathy in the left upper quadrant. I recommend CT scan of the abdomen for further evaluation.  Electronically Signed: By: Geanie Cooley M.D. On: 12/01/2013 18:04      Disposition and Follow-up:     Discharge Orders  Future Orders Complete By Expires   Diet - low sodium heart healthy  As directed    Increase activity slowly  As directed        DISPOSITION: Home  DIET: Regular diet  TESTS THAT NEED FOLLOW-UP TPMT levels  DISCHARGE FOLLOW-UP Follow-up Information   Follow up with HUNG,PATRICK D, MD. Schedule an appointment as soon as possible for a visit in 1 week. (for hospital follow-up)    Specialty:  Gastroenterology   Contact information:   [address] [place] [address] [phone]       Follow up with OSEI-BONSU,GEORGE, MD. Schedule an appointment as soon as possible for a visit in 2 weeks. (for hospital follow-up)    Specialty:  Internal Medicine   Contact information:   [address] [place] [address] [phone]       Time spent on Discharge: 40 mins  Signed:   Shaquavia Whisonant M.D. [facility] 12/04/2013, 10:54 AM Pager: [phone]

## 2013-12-04 NOTE — Progress Notes (Signed)
Subjective: No acute events.    Objective: Vital signs in last 24 hours: Temp:  [98.3 F (36.8 C)-98.4 F (36.9 C)] 98.3 F (36.8 C) (02/24 1925) Pulse Rate:  [83-96] 83 (02/25 0504) Resp:  [18] 18 (02/24 1411) BP: (86-114)/(66-77) 114/66 mmHg (02/25 0504) SpO2:  [99 %-100 %] 100 % (02/25 0504) Weight:  [156 lb (70.761 kg)] 156 lb (70.761 kg) (02/25 0504) Last BM Date: 12/03/13  Intake/Output from previous day: 02/24 0701 - 02/25 0700 In: 720 [P.O.:720] Out: -  Intake/Output this shift:    General appearance: alert and no distress GI: soft, non-tender; bowel sounds normal; no masses,  no organomegaly  Lab Results:  Recent Labs  12/01/13 1555 12/02/13 0009  WBC 13.0* 14.2*  HGB 7.0* 6.6*  HCT 22.3* 20.8*  PLT 767* 694*   BMET  Recent Labs  12/01/13 1555 12/02/13 0009  NA 137 136*  K 3.8 3.4*  CL 98 98  CO2 26 27  GLUCOSE 82 125*  BUN 6 5*  CREATININE 0.63 0.65  CALCIUM 8.6 8.3*   LFT  Recent Labs  12/01/13 1555 12/02/13 0009  PROT 7.0 6.5  ALBUMIN 2.5* 2.3*  AST 11 10  ALT 7 7  ALKPHOS 54 49  BILITOT <0.2* <0.2*  BILIDIR <0.2  --   IBILI NOT CALCULATED  --    PT/INR No results found for this basename: LABPROT, INR,  in the last 72 hours Hepatitis Panel No results found for this basename: HEPBSAG, HCVAB, HEPAIGM, HEPBIGM,  in the last 72 hours C-Diff No results found for this basename: CDIFFTOX,  in the last 72 hours Fecal Lactopherrin No results found for this basename: FECLLACTOFRN,  in the last 72 hours  Studies/Results: No results found.  Medications:  Scheduled:  Continuous: . sodium chloride 75 mL/hr at 12/04/13 0108    Assessment/Plan: 1) Uncontrolled UC.   The patient is stable.  She feels better from IV hydration and the iron infusion.  I received word that her TMPT levels will not be available until this Friday.  Even if I started the medication today she would not receive any benefit for several weeks.  The patient also  does not want to initiate any anti-TNF agents.  Plan: 1) Okay to D/C home.   2) I will follow up with her results.   LOS: 3 days   Katha Kuehne D 12/04/2013, 8:19 AM

## 2014-02-06 ENCOUNTER — Emergency Department (HOSPITAL_COMMUNITY): Payer: Medicaid Other

## 2014-02-06 ENCOUNTER — Encounter (HOSPITAL_COMMUNITY): Payer: Self-pay | Admitting: Emergency Medicine

## 2014-02-06 ENCOUNTER — Emergency Department (HOSPITAL_COMMUNITY)
Admission: EM | Admit: 2014-02-06 | Discharge: 2014-02-06 | Disposition: A | Discharge status: Home / Self Care | Payer: Medicaid Other | Attending: Emergency Medicine | Admitting: Emergency Medicine

## 2014-02-06 DIAGNOSIS — Z79899 Other long term (current) drug therapy: Secondary | ICD-10-CM | POA: Insufficient documentation

## 2014-02-06 DIAGNOSIS — Z8719 Personal history of other diseases of the digestive system: Secondary | ICD-10-CM | POA: Insufficient documentation

## 2014-02-06 DIAGNOSIS — R5381 Other malaise: Secondary | ICD-10-CM | POA: Insufficient documentation

## 2014-02-06 DIAGNOSIS — R5383 Other fatigue: Secondary | ICD-10-CM

## 2014-02-06 DIAGNOSIS — R0789 Other chest pain: Secondary | ICD-10-CM | POA: Insufficient documentation

## 2014-02-06 DIAGNOSIS — Z862 Personal history of diseases of the blood and blood-forming organs and certain disorders involving the immune mechanism: Secondary | ICD-10-CM | POA: Insufficient documentation

## 2014-02-06 LAB — COMPREHENSIVE METABOLIC PANEL
ALT: 15 U/L (ref 0–35)
AST: 17 U/L (ref 0–37)
Albumin: 3.2 g/dL — ABNORMAL LOW (ref 3.5–5.2)
Alkaline Phosphatase: 44 U/L (ref 39–117)
BUN: 13 mg/dL (ref 6–23)
CALCIUM: 8.7 mg/dL (ref 8.4–10.5)
CO2: 26 meq/L (ref 19–32)
Chloride: 102 mEq/L (ref 96–112)
Creatinine, Ser: 0.51 mg/dL (ref 0.50–1.10)
GFR calc Af Amer: >90 mL/min (ref >90)
GFR calc non Af Amer: >90 mL/min (ref >90)
Glucose, Bld: 90 mg/dL (ref 70–99)
Potassium: 3.8 mEq/L (ref 3.7–5.3)
SODIUM: 139 meq/L (ref 137–147)
Total Bilirubin: 0.3 mg/dL (ref 0.3–1.2)
Total Protein: 7.1 g/dL (ref 6.0–8.3)

## 2014-02-06 LAB — I-STAT CHEM 8, ED
BUN: 11 mg/dL (ref 6–23)
CHLORIDE: 101 meq/L (ref 96–112)
Calcium, Ion: 1.23 mmol/L (ref 1.12–1.23)
Creatinine, Ser: 0.6 mg/dL (ref 0.50–1.10)
Glucose, Bld: 85 mg/dL (ref 70–99)
HCT: 34 % — ABNORMAL LOW (ref 36.0–46.0)
Hemoglobin: 11.6 g/dL — ABNORMAL LOW (ref 12.0–15.0)
Potassium: 3.7 mEq/L (ref 3.7–5.3)
Sodium: 141 mEq/L (ref 137–147)
TCO2: 25 mmol/L (ref 0–100)

## 2014-02-06 LAB — CBC WITH DIFFERENTIAL/PLATELET
BASOS PCT: 1 % (ref 0–1)
Basophils Absolute: 0.1 10*3/uL (ref 0.0–0.1)
Eosinophils Absolute: 0.3 10*3/uL (ref 0.0–0.7)
Eosinophils Relative: 5 % (ref 0–5)
HCT: 31.5 % — ABNORMAL LOW (ref 36.0–46.0)
HEMOGLOBIN: 9.6 g/dL — AB (ref 12.0–15.0)
LYMPHS PCT: 19 % (ref 12–46)
Lymphs Abs: 1.3 10*3/uL (ref 0.7–4.0)
MCH: 24.9 pg — ABNORMAL LOW (ref 26.0–34.0)
MCHC: 30.5 g/dL (ref 30.0–36.0)
MCV: 81.8 fL (ref 78.0–100.0)
MONOS PCT: 6 % (ref 3–12)
Monocytes Absolute: 0.4 10*3/uL (ref 0.1–1.0)
NEUTROS ABS: 4.4 10*3/uL (ref 1.7–7.7)
NEUTROS PCT: 69 % (ref 43–77)
Platelets: 479 10*3/uL — ABNORMAL HIGH (ref 150–400)
RBC: 3.85 MIL/uL — AB (ref 3.87–5.11)
RDW: 19.3 % — ABNORMAL HIGH (ref 11.5–15.5)
WBC: 6.4 10*3/uL (ref 4.0–10.5)

## 2014-02-06 LAB — I-STAT TROPONIN, ED
Troponin i, poc: 0 ng/mL (ref 0.00–0.08)
Troponin i, poc: 0 ng/mL (ref 0.00–0.08)

## 2014-02-06 LAB — D-DIMER, QUANTITATIVE: D-Dimer, Quant: 0.27 ug/mL-FEU (ref 0.00–0.48)

## 2014-02-06 MED ORDER — KETOROLAC TROMETHAMINE 60 MG/2ML IM SOLN
60.0000 mg | Freq: Once | INTRAMUSCULAR | Status: AC
  Administered 2014-02-06: 60 mg via INTRAMUSCULAR
  Filled 2014-02-06: qty 2

## 2014-02-06 MED ORDER — MORPHINE SULFATE 4 MG/ML IJ SOLN
4.0000 mg | Freq: Once | INTRAMUSCULAR | Status: AC
  Administered 2014-02-06: 4 mg via INTRAVENOUS
  Filled 2014-02-06: qty 1

## 2014-02-06 MED ORDER — ONDANSETRON HCL 4 MG/2ML IJ SOLN
4.0000 mg | Freq: Once | INTRAMUSCULAR | Status: AC
  Administered 2014-02-06: 4 mg via INTRAVENOUS
  Filled 2014-02-06: qty 2

## 2014-02-06 MED ORDER — SODIUM CHLORIDE 0.9 % IV SOLN
INTRAVENOUS | Status: DC
  Administered 2014-02-06: 09:00:00 via INTRAVENOUS

## 2014-02-06 MED ORDER — OXYCODONE-ACETAMINOPHEN 5-325 MG PO TABS: 1.0000 | ORAL_TABLET | Freq: Four times a day (QID) | ORAL | Status: DC | PRN

## 2014-02-06 MED ORDER — ONDANSETRON HCL 4 MG PO TABS: 4.0000 mg | ORAL_TABLET | Freq: Four times a day (QID) | ORAL | Status: DC

## 2014-02-06 NOTE — ED Provider Notes (Signed)
Medical screening examination/treatment/procedure(s) were conducted as a shared visit with non-physician practitioner(s) and myself.  I personally evaluated the patient during the encounter.  Central chest pain since 6am, worse with sitting up, better with lying down. Tender to palpation on chest wall. No nausea, vomiting, diaphoresis. Similar chest pain in February when anemic. Cardiology saw in hospital in February. Hemoglobin stable today. Troponin neg x 2, EKG unchanged, D-dimer negative. Atypical for ACS.   EKG Interpretation   Date/Time:  Thursday February 06 2014 08:16:08 EDT Ventricular Rate:  64 PR Interval:  171 QRS Duration: 104 QT Interval:  452 QTC Calculation: 466 R Axis:   42 Text Interpretation:  Sinus rhythm Low voltage, precordial leads No  significant change was found Confirmed by Manus GunningANCOUR  MD, Nicola Quesnell ([id number]) on  02/06/2014 8:25:55 AM        Glynn OctaveStephen Eliannah Hinde, MD 02/06/14 1744

## 2014-02-06 NOTE — Discharge Instructions (Signed)
Chest Pain (Nonspecific) °It is often hard to give a specific diagnosis for the cause of chest pain. There is always a chance that your pain could be related to something serious, such as a heart attack or a blood clot in the lungs. You need to follow up with your caregiver for further evaluation. °CAUSES  °· Heartburn. °· Pneumonia or bronchitis. °· Anxiety or stress. °· Inflammation around your heart (pericarditis) or lung (pleuritis or pleurisy). °· A blood clot in the lung. °· A collapsed lung (pneumothorax). It can develop suddenly on its own (spontaneous pneumothorax) or from injury (trauma) to the chest. °· Shingles infection (herpes zoster virus). °The chest wall is composed of bones, muscles, and cartilage. Any of these can be the source of the pain. °· The bones can be bruised by injury. °· The muscles or cartilage can be strained by coughing or overwork. °· The cartilage can be affected by inflammation and become sore (costochondritis). °DIAGNOSIS  °Lab tests or other studies, such as X-rays, electrocardiography, stress testing, or cardiac imaging, may be needed to find the cause of your pain.  °TREATMENT  °· Treatment depends on what may be causing your chest pain. Treatment may include: °· Acid blockers for heartburn. °· Anti-inflammatory medicine. °· Pain medicine for inflammatory conditions. °· Antibiotics if an infection is present. °· You may be advised to change lifestyle habits. This includes stopping smoking and avoiding alcohol, caffeine, and chocolate. °· You may be advised to keep your head raised (elevated) when sleeping. This reduces the chance of acid going backward from your stomach into your esophagus. °· Most of the time, nonspecific chest pain will improve within 2 to 3 days with rest and mild pain medicine. °HOME CARE INSTRUCTIONS  °· If antibiotics were prescribed, take your antibiotics as directed. Finish them even if you start to feel better. °· For the next few days, avoid physical  activities that bring on chest pain. Continue physical activities as directed. °· Do not smoke. °· Avoid drinking alcohol. °· Only take over-the-counter or prescription medicine for pain, discomfort, or fever as directed by your caregiver. °· Follow your caregiver's suggestions for further testing if your chest pain does not go away. °· Keep any follow-up appointments you made. If you do not go to an appointment, you could develop lasting (chronic) problems with pain. If there is any problem keeping an appointment, you must call to reschedule. °SEEK MEDICAL CARE IF:  °· You think you are having problems from the medicine you are taking. Read your medicine instructions carefully. °· Your chest pain does not go away, even after treatment. °· You develop a rash with blisters on your chest. °SEEK IMMEDIATE MEDICAL CARE IF:  °· You have increased chest pain or pain that spreads to your arm, neck, jaw, back, or abdomen. °· You develop shortness of breath, an increasing cough, or you are coughing up blood. °· You have severe back or abdominal pain, feel nauseous, or vomit. °· You develop severe weakness, fainting, or chills. °· You have a fever. °THIS IS AN EMERGENCY. Do not wait to see if the pain will go away. Get medical help at once. Call your local emergency services (911 in [place] Do not drive yourself to the hospital. °MAKE SURE YOU:  °· Understand these instructions. °· Will watch your condition. °· Will get help right away if you are not doing well or get worse. °Document Released: 07/06/2005 Document Revised: 12/19/2011 Document Reviewed: 05/01/2008 °[employer]® Patient Information ©2014 [employer]   [employer]. ° °

## 2014-02-06 NOTE — ED Provider Notes (Signed)
CSN: [id number]     Arrival date & time 02/06/14  0810 History   First MD Initiated Contact with Patient 02/06/14 [phone]     Chief Complaint  Patient presents with  . Chest Pain     (Consider location/radiation/quality/duration/timing/severity/associated sxs/prior Treatment) HPI  Religious Beliefs: Refusal of blood transfusion, Jehovah's Witness  Gastroenterologist: Gastroenterology, Dr. Elnoria HowardHung - patient says she saw him this past Monday and that her hemoblogbin is 10.2 currently. Hx Anemia due to GI bleed from ulcerative colitis   The patient says around 6 am she woke up with chest pains that gradually got worse, they are substernal and sharp in nature. They are intermittent coming and going quickly. She denies any radiation. Associated symptoms are weakness. No sob, diaphoresis, nausea, vomiting or diarrhea. She denies having any abdominal pain or noticing any hematochezia, rectal bleeding or hematuria.    Past Medical History  Diagnosis Date  . Ulcerative colitis   . Family history of anesthesia complication     ' My Brother went into cardiac arrest "  . Chest pain 11/2013  . Refusal of blood transfusions as patient is Jehovah's Witness   . Anemia 11/2013   Past Surgical History  Procedure Laterality Date  . Tubal ligation    . Cholecystectomy    . Tonsillectomy     No family history on file. History  Substance Use Topics  . Smoking status: Never Smoker   . Smokeless tobacco: Never Used  . Alcohol Use: No   OB History   Grav Para Term Preterm Abortions TAB SAB Ect Mult Living                 Review of Systems   Review of Systems  Gen: no weight loss, fevers, chills, night sweats  Eyes: no discharge or drainage, no occular pain or visual changes  Nose: no epistaxis or rhinorrhea  Mouth: no dental pain, no sore throat  Neck: no neck pain  Lungs:No wheezing, coughing or hemoptysis CV: + chest pain, No palpitations, dependent edema or orthopnea  Abd: no abdominal  pain, nausea, vomiting, diarrhea GU: no dysuria or gross hematuria  MSK:  No muscle weakness or pain Neuro: no headache, no focal neurologic deficits, + generalized weakness Skin: no rash or wounds Psyche: no complaints    Allergies  Prednisone  Home Medications   Prior to Admission medications   Medication Sig Start Date End Date Taking? Authorizing Provider  Omega-3 Fatty Acids (FISH OIL) 1000 MG CAPS Take 2 capsules by mouth daily.    Historical Provider, MD  Probiotic Product (PROBIOTIC DAILY PO) Take 1 tablet by mouth daily.    Historical Provider, MD   BP 116/71  Pulse 67  Temp(Src) 97.3 F (36.3 C) (Oral)  Resp 16  Ht 5\' 6"  (1.676 m)  Wt 164 lb (74.39 kg)  BMI 26.48 kg/m2  SpO2 100%  LMP 01/30/2014 Physical Exam  Nursing note and vitals reviewed. Constitutional: She appears well-developed and well-nourished. No distress.  HENT:  Head: Normocephalic and atraumatic.  Eyes: Pupils are equal, round, and reactive to light.  Neck: Normal range of motion. Neck supple.  Cardiovascular: Normal rate and regular rhythm.   Pulmonary/Chest: Effort normal and breath sounds normal. She exhibits no tenderness, no crepitus and no retraction.  Abdominal: Soft.  Neurological: She is alert.  Skin: Skin is warm and dry.    ED Course  Procedures (including critical care time) Labs Review Labs Reviewed  CBC WITH DIFFERENTIAL - Abnormal; Notable  for the following:    RBC 3.85 (*)    Hemoglobin 9.6 (*)    HCT 31.5 (*)    MCH 24.9 (*)    RDW 19.3 (*)    Platelets 479 (*)    All other components within normal limits  COMPREHENSIVE METABOLIC PANEL - Abnormal; Notable for the following:    Albumin 3.2 (*)    All other components within normal limits  I-STAT CHEM 8, ED - Abnormal; Notable for the following:    Hemoglobin 11.6 (*)    HCT 34.0 (*)    All other components within normal limits  D-DIMER, QUANTITATIVE  I-STAT TROPOININ, ED  Rosezena SensorI-STAT TROPOININ, ED    Imaging  Review Dg Chest 2 View  02/06/2014   CLINICAL DATA:  Chest pain.  EXAM: CHEST  2 VIEW  COMPARISON:  CT ABD/PELV WO CM dated 12/01/2013; DG CHEST 2 VIEW dated 12/01/2013  FINDINGS: Normal heart size and clear lung fields.  No bony abnormality.  IMPRESSION: No active cardiopulmonary disease.   Electronically Signed   By: Davonna BellingJohn  Curnes M.D.   On: 02/06/2014 09:17     EKG Interpretation   Date/Time:  Thursday February 06 2014 08:16:08 EDT Ventricular Rate:  64 PR Interval:  171 QRS Duration: 104 QT Interval:  452 QTC Calculation: 466 R Axis:   42 Text Interpretation:  Sinus rhythm Low voltage, precordial leads No  significant change was found Confirmed by Manus GunningANCOUR  MD, STEPHEN ([id number]) on  02/06/2014 8:25:55 AM      MDM   Final diagnoses:  Atypical chest pain    Dr. Manus Gunningancour saw the patient as well and feels that this would be very atypical for ACS pain. Her hemoglobin today remains stable, as she reported. Her  EKG, Chest xray and first and delta trop are all reassuring. She also had a negative d-dimer. Her pain was treated in the ED and she has been given Rx for pain medications for home. She is to f/u with her pcp. She says that she can usually get in to see her PCP quick.  48 y.o.Hayes Ludwiguth Gracia's evaluation in the Emergency Department is complete. It has been determined that no acute conditions requiring further emergency intervention are present at this time. The patient/guardian have been advised of the diagnosis and plan. We have discussed signs and symptoms that warrant return to the ED, such as changes or worsening in symptoms.  Vital signs are stable at discharge. Filed Vitals:   02/06/14 0820  BP: 116/71  Pulse: 67  Temp: 97.3 F (36.3 C)  Resp: 16    Patient/guardian has voiced understanding and agreed to follow-up with the PCP or specialist.     Dorthula Matasiffany G Acquanetta Cabanilla, PA-C 02/06/14 1236

## 2014-02-06 NOTE — ED Notes (Signed)
Per EMS: woke up this am, 1 hour ago, center stabbing cp non radiating, no hx, no cardiac hx. NSR, minor incomplete LBBB Only medical issues she has are GI problems.   324 ASA, 1 NTG given PTA, mild decrease in pain from 10 to a 7.  Hurts to move, hurts to take a deep breath.  20 gauge left AC.  EMS VS: 112/68, 72 HR, 20 RR. 99%.   Patient states that she has had previous pain this past feb when she states her hemoglobin was 6.  States she has a history of colitis, denies abdominal pain, denies current rectal bleeding. Pain is reproducible with movement but not with palpation.

## 2017-02-20 ENCOUNTER — Encounter: Payer: Self-pay | Admitting: Obstetrics and Gynecology

## 2017-02-20 ENCOUNTER — Other Ambulatory Visit (HOSPITAL_COMMUNITY)
Admission: RE | Admit: 2017-02-20 | Discharge: 2017-02-20 | Disposition: A | Discharge status: Home / Self Care | Payer: Medicaid Other | Source: Ambulatory Visit | Attending: Obstetrics and Gynecology | Admitting: Obstetrics and Gynecology

## 2017-02-20 ENCOUNTER — Ambulatory Visit (INDEPENDENT_AMBULATORY_CARE_PROVIDER_SITE_OTHER): Payer: Medicaid Other | Admitting: Obstetrics and Gynecology

## 2017-02-20 VITALS — BP 135/76 | HR 79 | Temp 97.1°F | Ht 66.0 in | Wt 191.0 lb

## 2017-02-20 DIAGNOSIS — Z1151 Encounter for screening for human papillomavirus (HPV): Secondary | ICD-10-CM | POA: Diagnosis not present

## 2017-02-20 DIAGNOSIS — Z01419 Encounter for gynecological examination (general) (routine) without abnormal findings: Secondary | ICD-10-CM | POA: Insufficient documentation

## 2017-02-20 DIAGNOSIS — Z124 Encounter for screening for malignant neoplasm of cervix: Secondary | ICD-10-CM | POA: Diagnosis not present

## 2017-02-20 NOTE — Progress Notes (Signed)
Subjective:     Kelli Whitaker is a 51 y.o. female with BMI 30 and LMP 4/30 who is here for a comprehensive physical exam. The patient reports no problems. She is sexually active using BTL for contraception. She continues to have a monthly period lasting 4-5 days. She denies dysmenorrhea, pelvic pain or abnormal discharge. She denies leakage of urine  Past Medical History:  Diagnosis Date  . Anemia 11/2013  . Chest pain 11/2013  . Family history of anesthesia complication    ' My Brother went into cardiac arrest "  . Refusal of blood transfusions as patient is Jehovah's Witness   . Ulcerative colitis    Past Surgical History:  Procedure Laterality Date  . CHOLECYSTECTOMY    . TONSILLECTOMY    . TUBAL LIGATION     No family history on file.  Social History   Social History  . Marital status: Legally Separated    Spouse name: N/A  . Number of children: N/A  . Years of education: N/A   Occupational History  . Not on file.   Social History Main Topics  . Smoking status: Never Smoker  . Smokeless tobacco: Never Used  . Alcohol use No  . Drug use: No  . Sexual activity: Not on file   Other Topics Concern  . Not on file   Social History Narrative  . No narrative on file   There are no preventive care reminders to display for this patient.     Review of Systems Pertinent items are noted in HPI.   Objective:       GENERAL: Well-developed, well-nourished female in no acute distress.  HEENT: Normocephalic, atraumatic. Sclerae anicteric.  NECK: Supple. Normal thyroid.  LUNGS: Clear to auscultation bilaterally.  HEART: Regular rate and rhythm. BREASTS: Symmetric in size. No palpable masses or lymphadenopathy, skin changes, or nipple drainage. ABDOMEN: Soft, nontender, nondistended. No organomegaly. PELVIC: Normal external female genitalia. Vagina is pink and rugated.  Normal discharge. Normal appearing cervix. Uterus is normal in size. No adnexal mass or  tenderness. EXTREMITIES: No cyanosis, clubbing, or edema, 2+ distal pulses.  Assessment:    Healthy female exam.      Plan:    Pap smear collected Screening mammogram ordered Patient will be contacted with abnormal results Patient to follow up with PCP for fasting labs.  See After Visit Summary for Counseling Recommendations

## 2017-02-20 NOTE — Progress Notes (Signed)
Patient presents for New Pt. Annual Exam/PAP

## 2017-02-22 LAB — CYTOLOGY - PAP
Adequacy: ABSENT
Diagnosis: NEGATIVE
HPV: NOT DETECTED

## 2017-03-10 ENCOUNTER — Ambulatory Visit
Admission: RE | Admit: 2017-03-10 | Discharge: 2017-03-10 | Disposition: A | Discharge status: Home / Self Care | Payer: Medicaid Other | Source: Ambulatory Visit | Attending: Obstetrics and Gynecology | Admitting: Obstetrics and Gynecology

## 2017-03-10 DIAGNOSIS — Z01419 Encounter for gynecological examination (general) (routine) without abnormal findings: Secondary | ICD-10-CM

## 2017-03-14 ENCOUNTER — Other Ambulatory Visit: Payer: Self-pay | Admitting: Obstetrics and Gynecology

## 2017-03-14 DIAGNOSIS — R928 Other abnormal and inconclusive findings on diagnostic imaging of breast: Secondary | ICD-10-CM

## 2017-03-16 ENCOUNTER — Other Ambulatory Visit: Payer: Medicaid Other

## 2017-03-17 ENCOUNTER — Ambulatory Visit
Admission: RE | Admit: 2017-03-17 | Discharge: 2017-03-17 | Disposition: A | Discharge status: Home / Self Care | Payer: Medicaid Other | Source: Ambulatory Visit | Attending: Obstetrics and Gynecology | Admitting: Obstetrics and Gynecology

## 2017-03-17 DIAGNOSIS — R928 Other abnormal and inconclusive findings on diagnostic imaging of breast: Secondary | ICD-10-CM

## 2017-08-03 ENCOUNTER — Other Ambulatory Visit: Payer: Self-pay | Admitting: Obstetrics and Gynecology

## 2017-08-03 DIAGNOSIS — R921 Mammographic calcification found on diagnostic imaging of breast: Secondary | ICD-10-CM

## 2017-11-09 ENCOUNTER — Other Ambulatory Visit: Payer: Self-pay | Admitting: Obstetrics and Gynecology

## 2017-11-09 ENCOUNTER — Ambulatory Visit
Admission: RE | Admit: 2017-11-09 | Discharge: 2017-11-09 | Disposition: A | Discharge status: Home / Self Care | Payer: Medicaid Other | Source: Ambulatory Visit | Attending: Obstetrics and Gynecology | Admitting: Obstetrics and Gynecology

## 2017-11-09 DIAGNOSIS — R921 Mammographic calcification found on diagnostic imaging of breast: Secondary | ICD-10-CM

## 2018-11-03 ENCOUNTER — Encounter (HOSPITAL_COMMUNITY): Payer: Self-pay

## 2018-11-03 ENCOUNTER — Emergency Department (HOSPITAL_COMMUNITY): Payer: Medicaid Other

## 2018-11-03 ENCOUNTER — Ambulatory Visit (HOSPITAL_COMMUNITY)
Admission: EM | Admit: 2018-11-03 | Discharge: 2018-11-03 | Discharge status: Against Medical Advice | Payer: Medicaid Other

## 2018-11-03 ENCOUNTER — Emergency Department (HOSPITAL_COMMUNITY)
Admission: EM | Admit: 2018-11-03 | Discharge: 2018-11-03 | Disposition: A | Discharge status: Home / Self Care | Payer: Medicaid Other | Attending: Emergency Medicine | Admitting: Emergency Medicine

## 2018-11-03 DIAGNOSIS — R509 Fever, unspecified: Secondary | ICD-10-CM | POA: Diagnosis present

## 2018-11-03 DIAGNOSIS — J111 Influenza due to unidentified influenza virus with other respiratory manifestations: Secondary | ICD-10-CM

## 2018-11-03 DIAGNOSIS — Z79899 Other long term (current) drug therapy: Secondary | ICD-10-CM | POA: Diagnosis not present

## 2018-11-03 DIAGNOSIS — R112 Nausea with vomiting, unspecified: Secondary | ICD-10-CM

## 2018-11-03 DIAGNOSIS — R69 Illness, unspecified: Secondary | ICD-10-CM

## 2018-11-03 DIAGNOSIS — J101 Influenza due to other identified influenza virus with other respiratory manifestations: Secondary | ICD-10-CM | POA: Diagnosis not present

## 2018-11-03 LAB — CBC
HCT: 47.1 % — ABNORMAL HIGH (ref 36.0–46.0)
Hemoglobin: 15.2 g/dL — ABNORMAL HIGH (ref 12.0–15.0)
MCH: 30 pg (ref 26.0–34.0)
MCHC: 32.3 g/dL (ref 30.0–36.0)
MCV: 93.1 fL (ref 80.0–100.0)
Platelets: 334 10*3/uL (ref 150–400)
RBC: 5.06 MIL/uL (ref 3.87–5.11)
RDW: 14.6 % (ref 11.5–15.5)
WBC: 9.3 10*3/uL (ref 4.0–10.5)
nRBC: 0 % (ref 0.0–0.2)

## 2018-11-03 LAB — COMPREHENSIVE METABOLIC PANEL
ALT: 33 U/L (ref 0–44)
AST: 31 U/L (ref 15–41)
Albumin: 4.2 g/dL (ref 3.5–5.0)
Alkaline Phosphatase: 37 U/L — ABNORMAL LOW (ref 38–126)
Anion gap: 11 (ref 5–15)
BILIRUBIN TOTAL: 0.7 mg/dL (ref 0.3–1.2)
BUN: 9 mg/dL (ref 6–20)
CO2: 26 mmol/L (ref 22–32)
Calcium: 9.2 mg/dL (ref 8.9–10.3)
Chloride: 98 mmol/L (ref 98–111)
Creatinine, Ser: 0.76 mg/dL (ref 0.44–1.00)
GFR calc Af Amer: >60 mL/min (ref >60)
GFR calc non Af Amer: >60 mL/min (ref >60)
Glucose, Bld: 90 mg/dL (ref 70–99)
Potassium: 3.4 mmol/L — ABNORMAL LOW (ref 3.5–5.1)
Sodium: 135 mmol/L (ref 135–145)
TOTAL PROTEIN: 8.4 g/dL — AB (ref 6.5–8.1)

## 2018-11-03 LAB — I-STAT BETA HCG BLOOD, ED (MC, WL, AP ONLY): I-stat hCG, quantitative: <5 m[IU]/mL (ref <5)

## 2018-11-03 LAB — LIPASE, BLOOD: Lipase: 39 U/L (ref 11–51)

## 2018-11-03 MED ORDER — BENZONATATE 100 MG PO CAPS: 100.0000 mg | ORAL_CAPSULE | Freq: Three times a day (TID) | ORAL | 0 refills | Status: DC | PRN

## 2018-11-03 MED ORDER — ONDANSETRON HCL 4 MG/2ML IJ SOLN
4.0000 mg | Freq: Once | INTRAMUSCULAR | Status: AC
  Administered 2018-11-03: 4 mg via INTRAVENOUS
  Filled 2018-11-03: qty 2

## 2018-11-03 MED ORDER — SODIUM CHLORIDE 0.9% FLUSH: 3.0000 mL | Freq: Once | INTRAVENOUS | Status: DC

## 2018-11-03 MED ORDER — SODIUM CHLORIDE 0.9 % IV BOLUS
1000.0000 mL | Freq: Once | INTRAVENOUS | Status: AC
  Administered 2018-11-03: 1000 mL via INTRAVENOUS

## 2018-11-03 MED ORDER — POTASSIUM CHLORIDE CRYS ER 20 MEQ PO TBCR
40.0000 meq | EXTENDED_RELEASE_TABLET | Freq: Once | ORAL | Status: AC
  Administered 2018-11-03: 40 meq via ORAL
  Filled 2018-11-03: qty 2

## 2018-11-03 MED ORDER — ONDANSETRON 4 MG PO TBDP: 4.0000 mg | ORAL_TABLET | Freq: Three times a day (TID) | ORAL | 0 refills | Status: DC | PRN

## 2018-11-03 NOTE — ED Notes (Signed)
The pt has been ill  Since yesterday with flu symptoms  Her children were sick first alert no distress  No temp

## 2018-11-03 NOTE — ED Provider Notes (Signed)
[facility]   CSN: [id number] Arrival date & time: 11/03/18  1415     History   Chief Complaint Chief Complaint  Patient presents with  . Generalized Body Aches  . Emesis  . Loss of Consciousness    HPI Morton PetersRuth Rodden is a 53 y.o. female with history of anemia, ulcerative colitis presents for evaluation of acute onset, persistent flulike symptoms since yesterday.  She reports generalized myalgias, subjective fevers and chills, sore throat, nasal congestion, and cough productive of clear sputum.  She denies shortness of breath or chest pain.  Later in the day she developed nausea and vomiting and has had 6-7 episodes of nonbloody nonbilious emesis.  She notes mild generalized abdominal soreness associated with vomiting but otherwise denies abdominal pain, urinary symptoms, diarrhea, or constipation.  She does report that in the middle of the night at around 1 AM while she was vomiting she had a syncopal episode and awoke on the floor of the bathroom.  She does feel as though she struck her right cheek on the bathtub as she has some soreness to this area.  Denies any prodrome leading up to the syncopal episode such as chest pain or lightheadedness other than the active vomiting.  She denies any headaches, vision changes, numbness, tingling, or weakness presently.  She is not currently anticoagulated.  Her children at home have had similar symptoms.  She has tried over-the-counter medications with some relief.  She is a non-smoker.  The history is provided by the patient.    Past Medical History:  Diagnosis Date  . Anemia 11/2013  . Chest pain 11/2013  . Family history of anesthesia complication    ' My Brother went into cardiac arrest "  . Refusal of blood transfusions as patient is Jehovah's Witness   . Ulcerative colitis [facility](HCC)     Patient Active Problem List   Diagnosis Date Noted  . Refusal of blood transfusions as patient is  Jehovah's Witness 12/03/2013  . Chest pain 12/01/2013  . Ulcerative colitis (HCC) 12/01/2013  . Anemia 12/01/2013    Past Surgical History:  Procedure Laterality Date  . CHOLECYSTECTOMY    . TONSILLECTOMY    . TUBAL LIGATION       OB History    Gravida  9   Para  9   Term  9   Preterm  0   AB      Living  9     SAB      TAB      Ectopic      Multiple      Live Births               Home Medications    Prior to Admission medications   Medication Sig Start Date End Date Taking? Authorizing Provider  azaTHIOprine (IMURAN) 50 MG tablet Take 150 mg by mouth daily.   Yes [provider]  IRON PO Take 1 tablet by mouth daily.   Yes [provider]  mesalamine (CANASA) 1000 MG suppository Place 1,000 mg rectally at bedtime.   Yes [provider]  Mesalamine (LIALDA PO) Take 2 tablets by mouth daily.   Yes [provider]  Multiple Vitamins-Minerals (MULTIVITAL PO) Take 1 tablet by mouth daily.   Yes [provider]  benzonatate (TESSALON) 100 MG capsule Take 1 capsule (100 mg total) by mouth 3 (three) times daily as needed for cough. 11/03/18   Jeanie SewerFawze, Benoit Meech A,  PA-C  ondansetron (ZOFRAN ODT) 4 MG disintegrating tablet Take 1 tablet (4 mg total) by mouth every 8 (eight) hours as needed for nausea or vomiting. 11/03/18   Luevenia Maxin, Dorse Locy A, PA-C  ondansetron (ZOFRAN) 4 MG tablet Take 1 tablet (4 mg total) by mouth every 6 (six) hours. Patient not taking: Reported on 02/20/2017 02/06/14   Marlon Pel, PA-C  oxyCODONE-acetaminophen (PERCOCET/ROXICET) 5-325 MG per tablet Take 1-2 tablets by mouth every 6 (six) hours as needed for severe pain. Patient not taking: Reported on 02/20/2017 02/06/14   Marlon Pel, PA-C    Family History No family history on file.  Social History Social History   Tobacco Use  . Smoking status: Never Smoker  . Smokeless tobacco: Never Used  Substance Use Topics  . Alcohol use: No  . Drug use: No      Allergies   Prednisone   Review of Systems Review of Systems  Constitutional: Positive for fever.  HENT: Positive for congestion and sore throat.   Respiratory: Positive for cough. Negative for shortness of breath.   Cardiovascular: Negative for chest pain.  Gastrointestinal: Positive for nausea and vomiting. Negative for abdominal pain, constipation and diarrhea.  Neurological: Positive for syncope. Negative for headaches.  All other systems reviewed and are negative.    Physical Exam Updated Vital Signs BP 126/70   Pulse (!) 103   Temp 99.7 F (37.6 C) (Oral)   Resp (!) 22   LMP 09/03/2018 (Approximate)   SpO2 100%   Physical Exam Vitals signs and nursing note reviewed.  Constitutional:      General: She is not in acute distress.    Appearance: She is well-developed.  HENT:     Head: Normocephalic.     Comments: No Battle's signs, no raccoon's eyes, no rhinorrhea. No hemotympanum. No tenderness to palpation of the skull.  Mild tenderness to palpation of the right zygomatic arch.  No deformity, crepitus, or swelling noted.     Right Ear: Tympanic membrane, ear canal and external ear normal.     Left Ear: Tympanic membrane, ear canal and external ear normal.     Nose: Congestion present.     Mouth/Throat:     Mouth: Mucous membranes are dry.     Pharynx: No oropharyngeal exudate or posterior oropharyngeal erythema.     Comments: Tonsils surgically absent.  No uvular deviation.  No sublingual abnormalities.  No trismus.  No exudates.  Tolerating secretions without difficulty. Eyes:     General:        Right eye: No discharge.        Left eye: No discharge.     Extraocular Movements: Extraocular movements intact.     Conjunctiva/sclera: Conjunctivae normal.     Pupils: Pupils are equal, round, and reactive to light.  Neck:     Musculoskeletal: Normal range of motion and neck supple. No neck rigidity.     Vascular: No carotid bruit or JVD.     Trachea: No  tracheal deviation.  Cardiovascular:     Rate and Rhythm: Tachycardia present.     Pulses: Normal pulses.     Heart sounds: Normal heart sounds.  Pulmonary:     Effort: Pulmonary effort is normal.     Breath sounds: Normal breath sounds.  Chest:     Chest wall: No tenderness.  Abdominal:     General: Abdomen is flat. Bowel sounds are normal. There is no distension.     Tenderness: There is no right  CVA tenderness, left CVA tenderness, guarding or rebound.  Lymphadenopathy:     Cervical: No cervical adenopathy.  Skin:    General: Skin is warm and dry.     Findings: No erythema.  Neurological:     Mental Status: She is alert.  Psychiatric:        Behavior: Behavior normal.      ED Treatments / Results  Labs (all labs ordered are listed, but only abnormal results are displayed) Labs Reviewed  COMPREHENSIVE METABOLIC PANEL - Abnormal; Notable for the following components:      Result Value   Potassium 3.4 (*)    Total Protein 8.4 (*)    Alkaline Phosphatase 37 (*)    All other components within normal limits  CBC - Abnormal; Notable for the following components:   Hemoglobin 15.2 (*)    HCT 47.1 (*)    All other components within normal limits  LIPASE, BLOOD  I-STAT BETA HCG BLOOD, ED ([facility], [facility], [facility] ONLY)    EKG EKG Interpretation  Date/Time:  Saturday November 03 2018 14:26:18 EST Ventricular Rate:  110 PR Interval:  164 QRS Duration: 142 QT Interval:  366 QTC Calculation: 495 R Axis:   -64 Text Interpretation:  Sinus tachycardia Right bundle branch block Left anterior fascicular block Confirmed by Virgina Norfolk [phone]) on 11/03/2018 2:45:22 PM   Radiology Dg Chest 2 View  Result Date: 11/03/2018 CLINICAL DATA:  Nonproductive cough, chills, nausea, and vomiting for 2 days. EXAM: CHEST - 2 VIEW COMPARISON:  02/06/2014 FINDINGS: The heart size and mediastinal contours are within normal limits. Both lungs are clear. The visualized skeletal structures are  unremarkable. IMPRESSION: No active cardiopulmonary disease. Electronically Signed   By: Myles Rosenthal M.D.   On: 11/03/2018 16:01    Procedures Procedures (including critical care time)  Medications Ordered in ED Medications  sodium chloride 0.9 % bolus 1,000 mL (0 mLs Intravenous Stopped 11/03/18 1712)  ondansetron (ZOFRAN) injection 4 mg (4 mg Intravenous Given 11/03/18 1605)  potassium chloride SA (K-DUR,KLOR-CON) CR tablet 40 mEq (40 mEq Oral Given 11/03/18 1723)     Initial Impression / Assessment and Plan / ED Course  I have reviewed the triage vital signs and the nursing notes.  Pertinent labs & imaging results that were available during my care of the patient were reviewed by me and considered in my medical decision making (see chart for details).  Patient presenting for evaluation of flulike symptoms since yesterday.  She is afebrile, intermittently minimally tachycardic while in the ED with improvement after administration of IV fluids.  She is nontoxic in appearance.  She did have a syncopal episode while she was vomiting last night which appears consistent with vasovagal syncope.  Normal neurologic examination and no focal neurologic deficits today.  Doubt serious head injury, significant facial fracture, or acute intracranial abnormality. Discussed that head imaging does not appear to be indicated presently and patient agrees with this recommendation. Doubt PE, dissection, or ACS/MI as symptoms appear more infectious with known sick contacts. We discussed that she is within the window to receive Tamiflu if she test positive for the flu but she does not wish to take this medication so we will manage her symptoms.  Chest x-ray shows no acute cardiopulmonary abnormalities, no evidence of edema or consolidation.  Lab work reviewed by me shows no leukocytosis, however hemoglobin and hematocrit are elevated suggesting dehydration.  She has mild hypokalemia, replenished orally in the ED.  No  metabolic derangements, renal  insufficiency, or transaminitis noted.  Doubt acute surgical abdominal pathology given benign examination.  Patient was given IV fluids and Zofran in the ED and on reevaluation reports she is feeling much better.  She is tolerating p.o. fluids, serial abdominal examinations remain benign.  Will discharge with Zofran for nausea and Tessalon for cough.  Recommend follow-up with PCP for reevaluation of symptoms.  Discuss strict ED return precautions. Pt and her children's father verbalized understanding of and agreement with plan and patient is safe for discharge home at this time.  Final Clinical Impressions(s) / ED Diagnoses   Final diagnoses:  Influenza-like illness  Non-intractable vomiting with nausea, unspecified vomiting type    ED Discharge Orders         Ordered    ondansetron (ZOFRAN ODT) 4 MG disintegrating tablet  Every 8 hours PRN     11/03/18 1648    benzonatate (TESSALON) 100 MG capsule  3 times daily PRN     11/03/18 1648           Athanasios Heldman, CorningMina A, PA-C 11/04/18 0737    Virgina Norfolkuratolo, Adam, DO 11/04/18 1233

## 2018-11-03 NOTE — ED Notes (Signed)
Pt feels a litle better

## 2018-11-03 NOTE — Discharge Instructions (Signed)
Complaining of ordering plenty of rest.  Take Zofran as needed for nausea and vomiting.  Let this medicine dissolve under your tongue and then wait 10 to 20 minutes before having anything to eat or drink to give this medication time to work.  Eat a diet of bland foods that will not upset your stomach.  You can take Tessalon as needed for cough.  Gargle warm salt water and spit it out for sore throat. May also use cough drops, warm teas, etc. Take flonase to decrease nasal congestion.  Can use other over-the-counter medicines such as Zyrtec, Mucinex, and DayQuil for your symptoms.  Alternate 600 mg of ibuprofen and [phone] mg of Tylenol every 3 hours as needed for pain/fevers. Do not exceed 4000 mg of Tylenol daily.  Take ibuprofen with food to avoid upset stomach issues.   Followup with your primary care doctor in 3-5 days for recheck of ongoing symptoms. Return to emergency department for emergent changing or worsening of symptoms such as throat tightness, facial swelling, fever not controlled by ibuprofen or Tylenol,difficulty breathing, or chest pain.

## 2018-11-03 NOTE — ED Triage Notes (Signed)
Pt presents for evaluation of N/V, body aches, chills and URI symptoms starting yesterday morning. Pt reports syncopal event last night after vomiting.

## 2018-11-03 NOTE — ED Notes (Signed)
Unable tostart iv blood etc  Xray came took pt to the department

## 2018-12-22 ENCOUNTER — Other Ambulatory Visit: Payer: Self-pay

## 2018-12-22 ENCOUNTER — Emergency Department (HOSPITAL_COMMUNITY)
Admission: EM | Admit: 2018-12-22 | Discharge: 2018-12-22 | Disposition: A | Discharge status: Home / Self Care | Payer: Medicaid Other | Attending: Emergency Medicine | Admitting: Emergency Medicine

## 2018-12-22 DIAGNOSIS — S39012A Strain of muscle, fascia and tendon of lower back, initial encounter: Secondary | ICD-10-CM | POA: Diagnosis not present

## 2018-12-22 DIAGNOSIS — S3992XA Unspecified injury of lower back, initial encounter: Secondary | ICD-10-CM | POA: Diagnosis present

## 2018-12-22 DIAGNOSIS — X500XXA Overexertion from strenuous movement or load, initial encounter: Secondary | ICD-10-CM | POA: Diagnosis not present

## 2018-12-22 DIAGNOSIS — Y9389 Activity, other specified: Secondary | ICD-10-CM | POA: Diagnosis not present

## 2018-12-22 DIAGNOSIS — Y929 Unspecified place or not applicable: Secondary | ICD-10-CM | POA: Diagnosis not present

## 2018-12-22 DIAGNOSIS — Y999 Unspecified external cause status: Secondary | ICD-10-CM | POA: Insufficient documentation

## 2018-12-22 MED ORDER — OXYCODONE-ACETAMINOPHEN 5-325 MG PO TABS
1.0000 | ORAL_TABLET | Freq: Once | ORAL | Status: AC
  Administered 2018-12-22: 1 via ORAL
  Filled 2018-12-22: qty 1

## 2018-12-22 MED ORDER — CYCLOBENZAPRINE HCL 10 MG PO TABS: 10.0000 mg | ORAL_TABLET | Freq: Two times a day (BID) | ORAL | 0 refills | Status: DC | PRN

## 2018-12-22 MED ORDER — OXYCODONE-ACETAMINOPHEN 5-325 MG PO TABS: 1.0000 | ORAL_TABLET | Freq: Four times a day (QID) | ORAL | 0 refills | Status: DC | PRN

## 2018-12-22 MED ORDER — CYCLOBENZAPRINE HCL 10 MG PO TABS
5.0000 mg | ORAL_TABLET | Freq: Once | ORAL | Status: AC
  Administered 2018-12-22: 5 mg via ORAL
  Filled 2018-12-22: qty 1

## 2018-12-22 NOTE — ED Provider Notes (Signed)
[facility]-EMERGENCY DEPT Provider Note   CSN: [id number] Arrival date & time: 12/22/18  1318    History   Chief Complaint Chief Complaint  Patient presents with  . Back Pain    HPI Kelli Whitaker is a 53 y.o. female.     The history is provided by the patient. No language interpreter was used.  Back Pain     53 year old female with history of ulcerative colitis, Jehovah witness presenting for evaluation of back pain.  Patient report 3 days ago she developed pain to her left lower back after carrying a heavy bag of laundry.  Pain is described as a throbbing sharp sensation, nonradiating, persistent, which has become progressively worse.  Pain is 9 out of 10 worse with movement.  She does not complain of any fever chills no bowel bladder incontinence or saddle anesthesia and no rash.  No history of IV drug use active cancer.  Denies any radicular leg pain.  She has tried Tylenol and icy hot with mild improvement.  She report movement worsen her pain.  She does have history of ulcerative colitis and does not tolerate NSAIDs.  Past Medical History:  Diagnosis Date  . Anemia 11/2013  . Chest pain 11/2013  . Family history of anesthesia complication    ' My Brother went into cardiac arrest "  . Refusal of blood transfusions as patient is Jehovah's Witness   . Ulcerative colitis [facility])     Patient Active Problem List   Diagnosis Date Noted  . Refusal of blood transfusions as patient is Jehovah's Witness 12/03/2013  . Chest pain 12/01/2013  . Ulcerative colitis (HCC) 12/01/2013  . Anemia 12/01/2013    Past Surgical History:  Procedure Laterality Date  . CHOLECYSTECTOMY    . TONSILLECTOMY    . TUBAL LIGATION       OB History    Gravida  9   Para  9   Term  9   Preterm  0   AB      Living  9     SAB      TAB      Ectopic      Multiple      Live Births               Home Medications    Prior to Admission medications    Medication Sig Start Date End Date Taking? Authorizing Provider  azaTHIOprine (IMURAN) 50 MG tablet Take 150 mg by mouth daily.    [provider]  benzonatate (TESSALON) 100 MG capsule Take 1 capsule (100 mg total) by mouth 3 (three) times daily as needed for cough. 11/03/18   Fawze, Mina A, PA-C  IRON PO Take 1 tablet by mouth daily.    [provider]  mesalamine (CANASA) 1000 MG suppository Place 1,000 mg rectally at bedtime.    [provider]  Mesalamine (LIALDA PO) Take 2 tablets by mouth daily.    [provider]  Multiple Vitamins-Minerals (MULTIVITAL PO) Take 1 tablet by mouth daily.    [provider]  ondansetron (ZOFRAN ODT) 4 MG disintegrating tablet Take 1 tablet (4 mg total) by mouth every 8 (eight) hours as needed for nausea or vomiting. 11/03/18   Luevenia Maxin, Mina A, PA-C  ondansetron (ZOFRAN) 4 MG tablet Take 1 tablet (4 mg total) by mouth every 6 (six) hours. Patient not taking: Reported on 02/20/2017 02/06/14   Marlon Pel, PA-C  oxyCODONE-acetaminophen (PERCOCET/ROXICET) 5-325 MG per tablet  Take 1-2 tablets by mouth every 6 (six) hours as needed for severe pain. Patient not taking: Reported on 02/20/2017 02/06/14   Marlon Pel, PA-C    Family History No family history on file.  Social History Social History   Tobacco Use  . Smoking status: Never Smoker  . Smokeless tobacco: Never Used  Substance Use Topics  . Alcohol use: No  . Drug use: No     Allergies   Prednisone   Review of Systems Review of Systems  Musculoskeletal: Positive for back pain.  All other systems reviewed and are negative.    Physical Exam Updated Vital Signs BP (!) 129/94 (BP Location: Right Arm)   Pulse 75   Temp 97.9 F (36.6 C) (Oral)   Ht 5\' 6"  (1.676 m)   Wt 86.2 kg   LMP 09/03/2018 (Approximate)   SpO2 100%   BMI 30.67 kg/m   Physical Exam Vitals signs and nursing note reviewed.  Constitutional:      General: She is not in  acute distress.    Appearance: She is well-developed.  HENT:     Head: Atraumatic.  Eyes:     Conjunctiva/sclera: Conjunctivae normal.  Neck:     Musculoskeletal: Neck supple.  Abdominal:     Palpations: Abdomen is soft.     Tenderness: There is no abdominal tenderness.  Musculoskeletal:        General: Tenderness (point tenderness to left lumbar paraspinal muscle on palpation without overlying skin changes. no CVA tenderness, no midline spine tenderness.  negative SLR.) present.  Skin:    Capillary Refill: Capillary refill takes less than 2 seconds.     Findings: No rash.  Neurological:     Mental Status: She is alert.     Comments: Patellar deep tendon reflex intact bilaterally, no foot drops.      ED Treatments / Results  Labs (all labs ordered are listed, but only abnormal results are displayed) Labs Reviewed - No data to display  EKG None  Radiology No results found.  Procedures Procedures (including critical care time)  Medications Ordered in ED Medications  cyclobenzaprine (FLEXERIL) tablet 5 mg (5 mg Oral Given 12/22/18 1358)  oxyCODONE-acetaminophen (PERCOCET/ROXICET) 5-325 MG per tablet 1 tablet (1 tablet Oral Given 12/22/18 1358)     Initial Impression / Assessment and Plan / ED Course  I have reviewed the triage vital signs and the nursing notes.  Pertinent labs & imaging results that were available during my care of the patient were reviewed by me and considered in my medical decision making (see chart for details).        BP (!) 129/94 (BP Location: Right Arm)   Pulse 75   Temp 97.9 F (36.6 C) (Oral)   Ht 5\' 6"  (1.676 m)   Wt 86.2 kg   LMP 09/03/2018 (Approximate)   SpO2 100%   BMI 30.67 kg/m    Final Clinical Impressions(s) / ED Diagnoses   Final diagnoses:  Strain of lumbar region, initial encounter    ED Discharge Orders         Ordered    oxyCODONE-acetaminophen (PERCOCET/ROXICET) 5-325 MG tablet  Every 6 hours PRN      12/22/18 1506    cyclobenzaprine (FLEXERIL) 10 MG tablet  2 times daily PRN     12/22/18 1506          3:04 PM Patient here with tenderness to left lower back right at the lumbar paraspinal muscle after lifting heavy bag  several days prior.  No radicular pain.  No red flags.  Able to ambulate.  She is well-appearing.  Will provide symptomatic treatment and outpatient follow-up.  Return precaution discussed.  Low suspicion for kidney stones, UTI, sciatica, or other acute medical condition.   Fayrene Helper, PA-C 12/22/18 1507    Lorre Nick, MD 12/23/18 [phone]

## 2018-12-22 NOTE — ED Triage Notes (Signed)
Patient reports new onset back pain left lower quadrant, pain is limited to a 6 inch diameter spot in quadrant. Patient states no traumatic events or injury. Was completing house work 3 days ago when pain started. Has gotten progressively worse over the past 3 days. Patient unable to move around without exacerbating the pain. Has used icyhot for relief.

## 2019-12-27 ENCOUNTER — Ambulatory Visit: Payer: Medicaid Other

## 2020-09-04 IMAGING — CR DG CHEST 2V
2 series · 2 of 2 positions shown · non-contrast
Comparison: 02/06/2014

CLINICAL DATA: Nonproductive cough, chills, nausea, and vomiting
for 2 days.

EXAM:
CHEST - 2 VIEW

[chest pa]
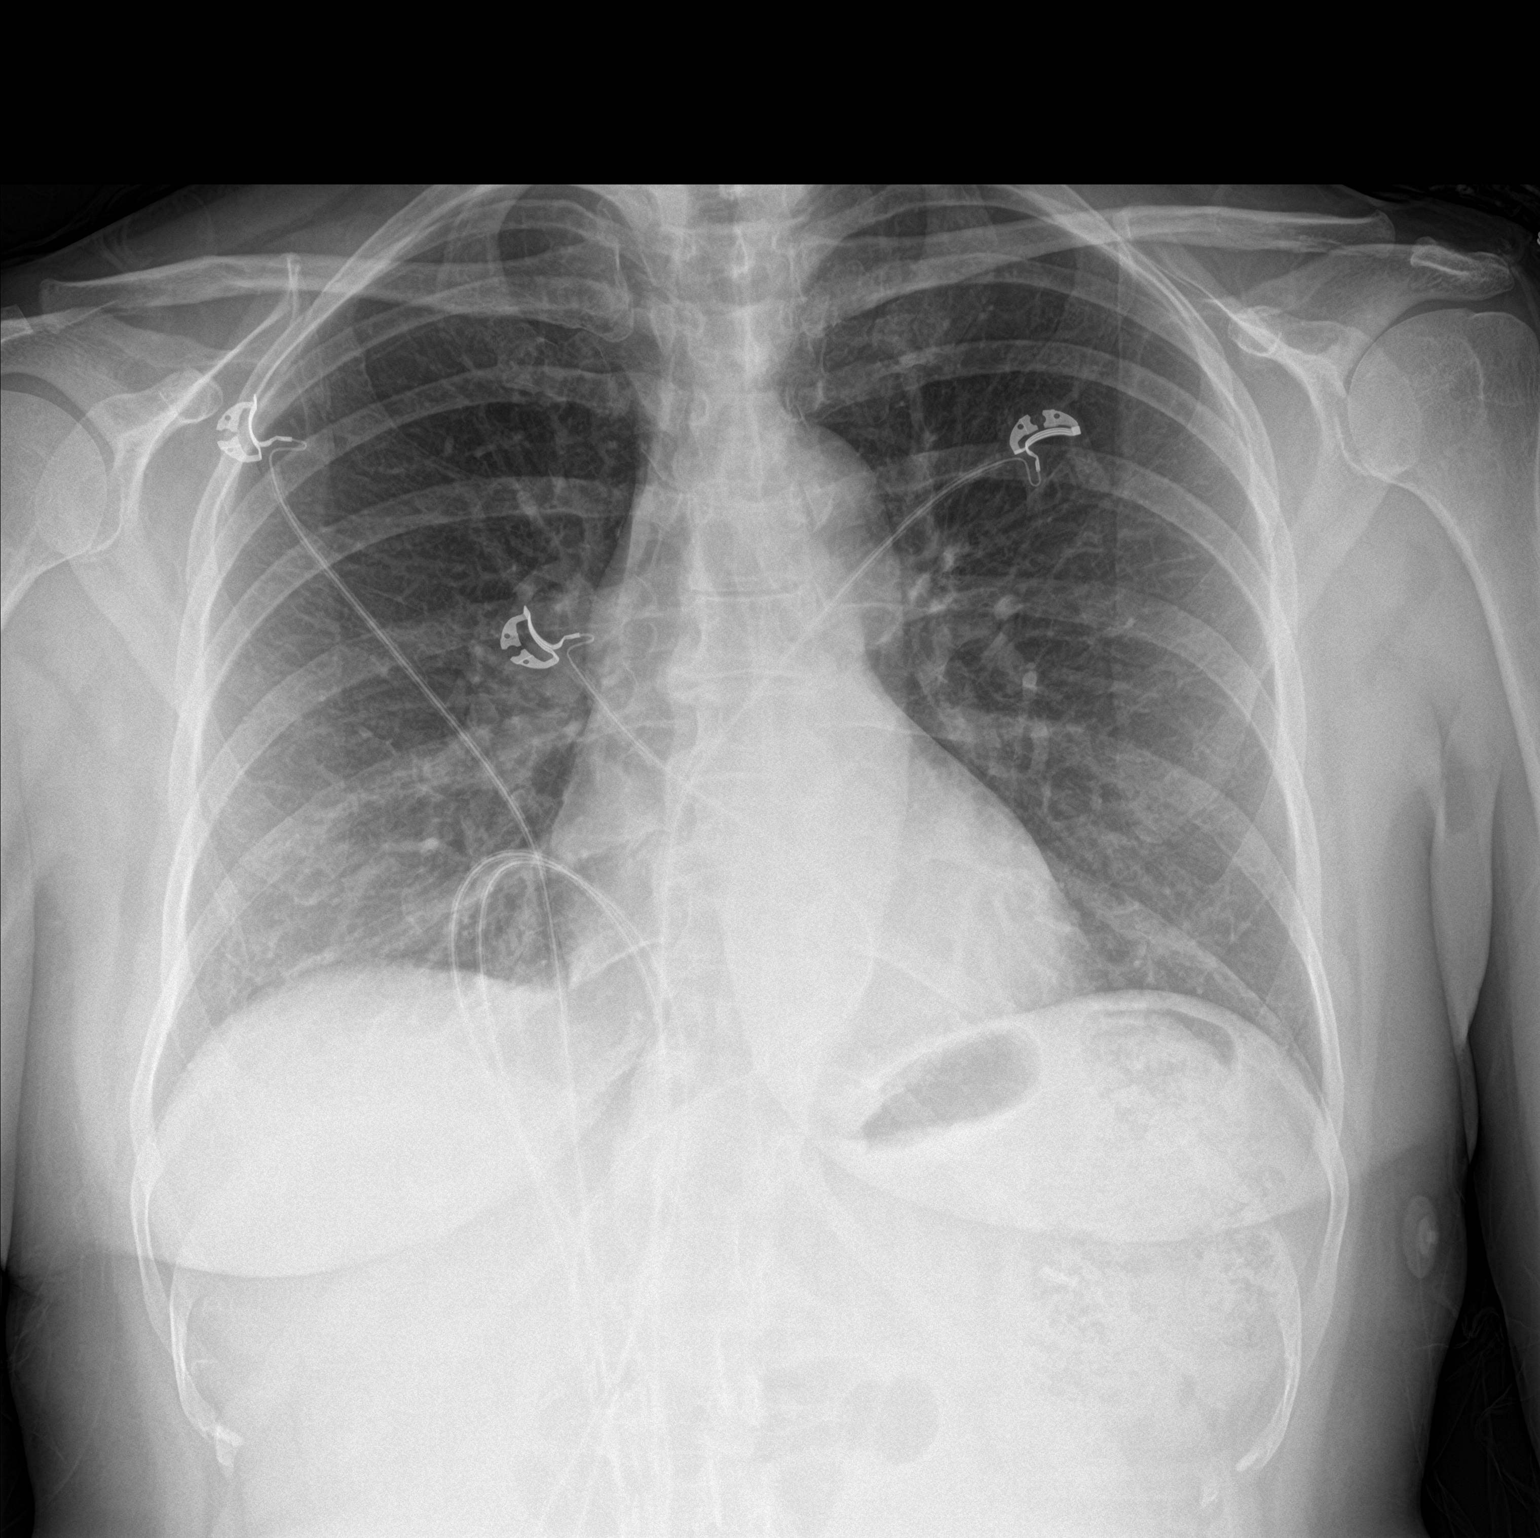

[chest lat]
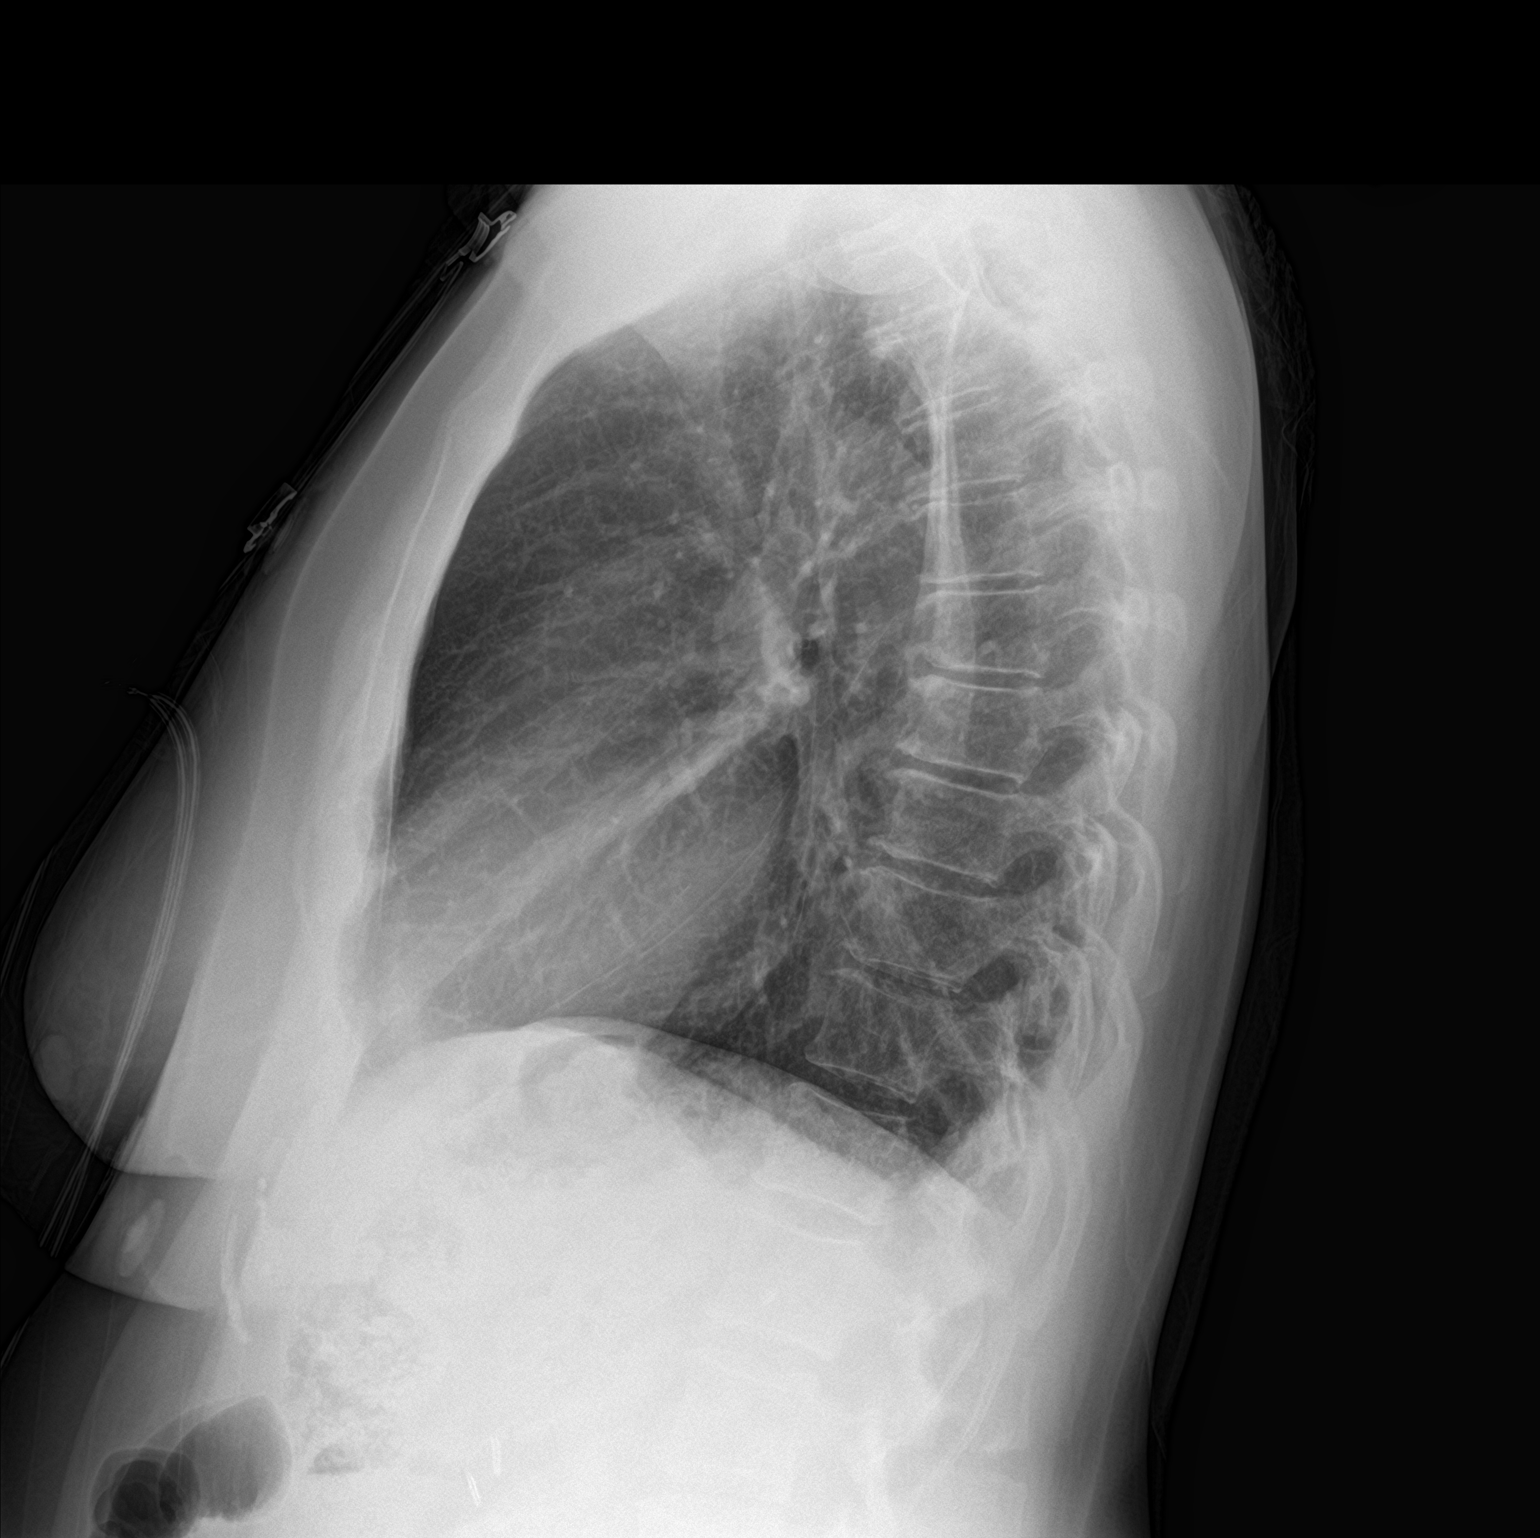

[2 of 2 positions shown; findings below may reference images not displayed]

FINDINGS: The heart size and mediastinal contours are within normal limits.
Both lungs are clear. The visualized skeletal structures are
unremarkable.
IMPRESSION: No active cardiopulmonary disease.

## 2021-06-07 ENCOUNTER — Ambulatory Visit: Payer: Medicaid Other | Admitting: Obstetrics and Gynecology

## 2021-06-23 ENCOUNTER — Other Ambulatory Visit: Payer: Self-pay | Admitting: Internal Medicine

## 2021-06-23 DIAGNOSIS — Z1231 Encounter for screening mammogram for malignant neoplasm of breast: Secondary | ICD-10-CM

## 2021-06-23 DIAGNOSIS — R5381 Other malaise: Secondary | ICD-10-CM

## 2021-07-06 ENCOUNTER — Other Ambulatory Visit: Payer: Self-pay | Admitting: Physician Assistant

## 2021-07-26 ENCOUNTER — Ambulatory Visit: Payer: Medicaid Other | Admitting: Obstetrics and Gynecology

## 2021-08-30 ENCOUNTER — Ambulatory Visit: Payer: Medicaid Other | Admitting: Obstetrics and Gynecology

## 2021-09-10 ENCOUNTER — Other Ambulatory Visit: Payer: Self-pay | Admitting: Internal Medicine

## 2021-09-10 DIAGNOSIS — Z780 Asymptomatic menopausal state: Secondary | ICD-10-CM

## 2021-09-10 DIAGNOSIS — M810 Age-related osteoporosis without current pathological fracture: Secondary | ICD-10-CM

## 2021-10-18 ENCOUNTER — Ambulatory Visit: Payer: Medicaid Other | Admitting: Obstetrics and Gynecology

## 2024-01-19 ENCOUNTER — Ambulatory Visit: Payer: Medicaid Other | Admitting: Obstetrics and Gynecology

## 2024-02-19 ENCOUNTER — Ambulatory Visit: Admitting: Obstetrics and Gynecology

## 2024-02-24 ENCOUNTER — Emergency Department (HOSPITAL_COMMUNITY)

## 2024-02-24 ENCOUNTER — Observation Stay (HOSPITAL_COMMUNITY)
Admission: EM | Admit: 2024-02-24 | Discharge: 2024-02-26 | Disposition: A | Discharge status: Home / Self Care | Attending: Internal Medicine | Admitting: Internal Medicine

## 2024-02-24 DIAGNOSIS — I10 Essential (primary) hypertension: Secondary | ICD-10-CM | POA: Insufficient documentation

## 2024-02-24 DIAGNOSIS — R197 Diarrhea, unspecified: Secondary | ICD-10-CM

## 2024-02-24 DIAGNOSIS — R109 Unspecified abdominal pain: Secondary | ICD-10-CM | POA: Diagnosis not present

## 2024-02-24 DIAGNOSIS — E860 Dehydration: Secondary | ICD-10-CM | POA: Insufficient documentation

## 2024-02-24 DIAGNOSIS — R112 Nausea with vomiting, unspecified: Secondary | ICD-10-CM

## 2024-02-24 DIAGNOSIS — K5190 Ulcerative colitis, unspecified, without complications: Secondary | ICD-10-CM | POA: Insufficient documentation

## 2024-02-24 DIAGNOSIS — R000 Tachycardia, unspecified: Secondary | ICD-10-CM | POA: Insufficient documentation

## 2024-02-24 DIAGNOSIS — R1084 Generalized abdominal pain: Secondary | ICD-10-CM

## 2024-02-24 DIAGNOSIS — K51019 Ulcerative (chronic) pancolitis with unspecified complications: Secondary | ICD-10-CM | POA: Diagnosis not present

## 2024-02-24 DIAGNOSIS — R1030 Lower abdominal pain, unspecified: Secondary | ICD-10-CM | POA: Diagnosis not present

## 2024-02-24 DIAGNOSIS — R933 Abnormal findings on diagnostic imaging of other parts of digestive tract: Secondary | ICD-10-CM

## 2024-02-24 DIAGNOSIS — R1032 Left lower quadrant pain: Principal | ICD-10-CM | POA: Insufficient documentation

## 2024-02-24 DIAGNOSIS — E876 Hypokalemia: Secondary | ICD-10-CM | POA: Diagnosis not present

## 2024-02-24 DIAGNOSIS — R55 Syncope and collapse: Principal | ICD-10-CM

## 2024-02-24 DIAGNOSIS — D72829 Elevated white blood cell count, unspecified: Secondary | ICD-10-CM | POA: Diagnosis not present

## 2024-02-24 LAB — COMPREHENSIVE METABOLIC PANEL WITH GFR
ALT: 19 U/L (ref 0–44)
AST: 19 U/L (ref 15–41)
Albumin: 3.6 g/dL (ref 3.5–5.0)
Alkaline Phosphatase: 48 U/L (ref 38–126)
Anion gap: 11 (ref 5–15)
BUN: 11 mg/dL (ref 6–20)
CO2: 22 mmol/L (ref 22–32)
Calcium: 8.6 mg/dL — ABNORMAL LOW (ref 8.9–10.3)
Chloride: 104 mmol/L (ref 98–111)
Creatinine, Ser: 0.66 mg/dL (ref 0.44–1.00)
GFR, Estimated: >60 mL/min (ref >60)
Glucose, Bld: 88 mg/dL (ref 70–99)
Potassium: 3.1 mmol/L — ABNORMAL LOW (ref 3.5–5.1)
Sodium: 137 mmol/L (ref 135–145)
Total Bilirubin: 1.2 mg/dL (ref 0.0–1.2)
Total Protein: 6.9 g/dL (ref 6.5–8.1)

## 2024-02-24 LAB — CBC
HCT: 40.4 % (ref 36.0–46.0)
Hemoglobin: 12.8 g/dL (ref 12.0–15.0)
MCH: 30.1 pg (ref 26.0–34.0)
MCHC: 31.7 g/dL (ref 30.0–36.0)
MCV: 95.1 fL (ref 80.0–100.0)
Platelets: 343 10*3/uL (ref 150–400)
RBC: 4.25 MIL/uL (ref 3.87–5.11)
RDW: 14.4 % (ref 11.5–15.5)
WBC: 16.7 10*3/uL — ABNORMAL HIGH (ref 4.0–10.5)
nRBC: 0 % (ref 0.0–0.2)

## 2024-02-24 LAB — LIPASE, BLOOD: Lipase: 34 U/L (ref 11–51)

## 2024-02-24 LAB — URINALYSIS, ROUTINE W REFLEX MICROSCOPIC
Bacteria, UA: NONE SEEN
Bilirubin Urine: NEGATIVE
Glucose, UA: NEGATIVE mg/dL
Ketones, ur: NEGATIVE mg/dL
Leukocytes,Ua: NEGATIVE
Nitrite: NEGATIVE
Protein, ur: NEGATIVE mg/dL
Specific Gravity, Urine: 1.019 (ref 1.005–1.030)
pH: 6 (ref 5.0–8.0)

## 2024-02-24 LAB — C DIFFICILE QUICK SCREEN W PCR REFLEX
C Diff antigen: NEGATIVE
C Diff interpretation: NOT DETECTED
C Diff toxin: NEGATIVE

## 2024-02-24 MED ORDER — SODIUM CHLORIDE 0.9 % IV SOLN: INTRAVENOUS | Status: AC

## 2024-02-24 MED ORDER — POTASSIUM CHLORIDE CRYS ER 20 MEQ PO TBCR
40.0000 meq | EXTENDED_RELEASE_TABLET | Freq: Once | ORAL | Status: AC
  Administered 2024-02-24: 40 meq via ORAL
  Filled 2024-02-24: qty 2

## 2024-02-24 MED ORDER — SODIUM CHLORIDE 0.9 % IV BOLUS
1000.0000 mL | Freq: Once | INTRAVENOUS | Status: AC
  Administered 2024-02-24: 1000 mL via INTRAVENOUS

## 2024-02-24 MED ORDER — ACETAMINOPHEN 650 MG RE SUPP: 650.0000 mg | Freq: Four times a day (QID) | RECTAL | Status: DC | PRN

## 2024-02-24 MED ORDER — ONDANSETRON HCL 4 MG/2ML IJ SOLN
4.0000 mg | Freq: Four times a day (QID) | INTRAMUSCULAR | Status: DC | PRN
Start: 2024-02-24 — End: 2024-02-26
  Administered 2024-02-24: 4 mg via INTRAVENOUS
  Filled 2024-02-24: qty 2

## 2024-02-24 MED ORDER — HYDROMORPHONE HCL 1 MG/ML IJ SOLN: 0.5000 mg | Freq: Four times a day (QID) | INTRAMUSCULAR | Status: DC | PRN

## 2024-02-24 MED ORDER — ACETAMINOPHEN 325 MG PO TABS
650.0000 mg | ORAL_TABLET | Freq: Four times a day (QID) | ORAL | Status: DC | PRN
  Administered 2024-02-24 – 2024-02-25 (×2): 650 mg via ORAL
  Filled 2024-02-24 (×2): qty 2

## 2024-02-24 MED ORDER — SENNOSIDES-DOCUSATE SODIUM 8.6-50 MG PO TABS: 1.0000 | ORAL_TABLET | Freq: Every evening | ORAL | Status: DC | PRN

## 2024-02-24 MED ORDER — IOHEXOL 300 MG/ML  SOLN
100.0000 mL | Freq: Once | INTRAMUSCULAR | Status: AC | PRN
Start: 2024-02-24 — End: 2024-02-24
  Administered 2024-02-24: 100 mL via INTRAVENOUS

## 2024-02-24 MED ORDER — MESALAMINE 1.2 G PO TBEC
2.4000 g | DELAYED_RELEASE_TABLET | Freq: Every day | ORAL | Status: DC
  Administered 2024-02-24 – 2024-02-26 (×3): 2.4 g via ORAL
  Filled 2024-02-24 (×3): qty 2

## 2024-02-24 MED ORDER — ONDANSETRON HCL 4 MG PO TABS: 4.0000 mg | ORAL_TABLET | Freq: Four times a day (QID) | ORAL | Status: DC | PRN

## 2024-02-24 MED ORDER — ONDANSETRON HCL 4 MG/2ML IJ SOLN
4.0000 mg | Freq: Once | INTRAMUSCULAR | Status: AC
  Administered 2024-02-24: 4 mg via INTRAVENOUS
  Filled 2024-02-24: qty 2

## 2024-02-24 MED ORDER — MESALAMINE 1000 MG RE SUPP
1000.0000 mg | Freq: Every day | RECTAL | Status: DC
  Administered 2024-02-25: 1000 mg via RECTAL
  Filled 2024-02-24 (×2): qty 1

## 2024-02-24 MED ORDER — AZATHIOPRINE 50 MG PO TABS
150.0000 mg | ORAL_TABLET | Freq: Every day | ORAL | Status: DC
  Administered 2024-02-24 – 2024-02-26 (×3): 150 mg via ORAL
  Filled 2024-02-24 (×3): qty 3

## 2024-02-24 MED ORDER — ENOXAPARIN SODIUM 40 MG/0.4ML IJ SOSY
40.0000 mg | PREFILLED_SYRINGE | INTRAMUSCULAR | Status: DC
  Administered 2024-02-24 – 2024-02-25 (×2): 40 mg via SUBCUTANEOUS
  Filled 2024-02-24 (×2): qty 0.4

## 2024-02-24 NOTE — ED Provider Notes (Signed)
 [place] EMERGENCY DEPARTMENT AT [facility] Provider Note   CSN: [id number] Arrival date & time: 02/24/24  1525     History  Chief Complaint  Patient presents with   Loss of Consciousness   Nausea    Kelli Whitaker is a 58 y.o. female with past medical history significant for ulcerative colitis who presents with concern for left lower quadrant pain, lower back pain this morning, 4 episodes of runny stools, vomiting and then a syncopal episode.  She was caught by her daughter.  She reports that she was feeling cold but denies any fever at home.  No chest pain or feeling of heart racing prior to passing out.  She has a cut on her right index finger that she sustained 2 days ago from something she reports as potentially rusty, unsure of last tetanus status, she had raised concern that this could have been the reason that she passed out, however she reports the wound is healing well.   Loss of Consciousness      Home Medications Prior to Admission medications   Medication Sig Start Date End Date Taking? Authorizing Provider  azaTHIOprine (IMURAN) 50 MG tablet Take 150 mg by mouth daily.    [provider]  benzonatate  (TESSALON ) 100 MG capsule Take 1 capsule (100 mg total) by mouth 3 (three) times daily as needed for cough. 11/03/18   Fawze, Mina A, PA-C  cyclobenzaprine  (FLEXERIL ) 10 MG tablet Take 1 tablet (10 mg total) by mouth 2 (two) times daily as needed for muscle spasms. 12/22/18   Debbra Fairy, PA-C  IRON  PO Take 1 tablet by mouth daily.    [provider]  mesalamine (CANASA) 1000 MG suppository Place 1,000 mg rectally at bedtime.    [provider]  Mesalamine (LIALDA PO) Take 2 tablets by mouth daily.    [provider]  Multiple Vitamins-Minerals (MULTIVITAL PO) Take 1 tablet by mouth daily.    [provider]  ondansetron  (ZOFRAN  ODT) 4 MG disintegrating tablet Take 1 tablet (4 mg total) by mouth every 8 (eight) hours  as needed for nausea or vomiting. 11/03/18   Alvira Azure, Mina A, PA-C  oxyCODONE -acetaminophen  (PERCOCET/ROXICET) 5-325 MG tablet Take 1 tablet by mouth every 6 (six) hours as needed for severe pain. 12/22/18   Debbra Fairy, PA-C      Allergies    Prednisone    Review of Systems   Review of Systems  Cardiovascular:  Positive for syncope.  All other systems reviewed and are negative.   Physical Exam Updated Vital Signs BP 128/73   Pulse (!) 129   Temp 98.6 F (37 C)   Resp 18   LMP 09/03/2018 (Approximate)   SpO2 98%  Physical Exam Vitals and nursing note reviewed.  Constitutional:      General: She is not in acute distress.    Appearance: Normal appearance.  HENT:     Head: Normocephalic and atraumatic.  Eyes:     General:        Right eye: No discharge.        Left eye: No discharge.  Cardiovascular:     Rate and Rhythm: Regular rhythm. Tachycardia present.     Heart sounds: No murmur heard.    No friction rub. No gallop.  Pulmonary:     Effort: Pulmonary effort is normal.     Breath sounds: Normal breath sounds.  Abdominal:     General: Bowel sounds are normal.     Palpations:  Abdomen is soft.     Comments: Some focal tenderness to palpation in the left lower quadrant  Skin:    General: Skin is warm and dry.     Capillary Refill: Capillary refill takes less than 2 seconds.  Neurological:     Mental Status: She is alert and oriented to person, place, and time.  Psychiatric:        Mood and Affect: Mood normal.        Behavior: Behavior normal.     ED Results / Procedures / Treatments   Labs (all labs ordered are listed, but only abnormal results are displayed) Labs Reviewed  CBC - Abnormal; Notable for the following components:      Result Value   WBC 16.7 (*)    All other components within normal limits  COMPREHENSIVE METABOLIC PANEL WITH GFR - Abnormal; Notable for the following components:   Potassium 3.1 (*)    Calcium 8.6 (*)    All other components  within normal limits  URINALYSIS, ROUTINE W REFLEX MICROSCOPIC - Abnormal; Notable for the following components:   Hgb urine dipstick MODERATE (*)    All other components within normal limits  STOOL CULTURE  C DIFFICILE QUICK SCREEN W PCR REFLEX    LIPASE, BLOOD    EKG None  Radiology CT ABDOMEN PELVIS W CONTRAST Result Date: 02/24/2024 CLINICAL DATA:  Abdominal pain, acute, nonlocalized. History of ulcerative colitis. EXAM: CT ABDOMEN AND PELVIS WITH CONTRAST TECHNIQUE: Multidetector CT imaging of the abdomen and pelvis was performed using the standard protocol following bolus administration of intravenous contrast. RADIATION DOSE REDUCTION: This exam was performed according to the departmental dose-optimization program which includes automated exposure control, adjustment of the mA and/or kV according to patient size and/or use of iterative reconstruction technique. 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 RIGHT lower lobe atelectasis. Hepatobiliary: Liver is diffusely hypoattenuating. Status post cholecystectomy. Pancreas: Unremarkable. No pancreatic ductal dilatation or surrounding inflammatory changes. Spleen: Unremarkable. Adrenals/Urinary Tract: Adrenal glands are unremarkable. Kidneys enhance symmetrically. Bilateral parapelvic cysts and extrarenal pelves. LEFT-sided extrarenal pelvis is mildly increased in size compared to remote prior with no etiology for obstruction identified. Bladder is unremarkable. Stomach/Bowel: No evidence of bowel obstruction. There are multiple areas of circumferential bowel wall thickening or featureless areas throughout the colon. This is most pronounced in the sigmoid colon, splenic flexure, mid transverse colon and RIGHT hemicolon. Small hiatal hernia. Mild gastric fold prominence of the decompressed stomach. Vascular/Lymphatic: No significant vascular  findings are present. Similar prominent LEFT periaortic retroperitoneal lymph nodes with representative LEFT periaortic lymph node measuring 7 mm in the short axis, unchanged (series 2, image 39). Reproductive: Heterogeneous enhancement of the uterus of multiple prominent parametrial vessels. Other: No free air or free fluid. Musculoskeletal: LEFT iliac bone island. Degenerative changes of the lower lumbar spine. IMPRESSION: 1. There are multiple areas of circumferential bowel wall thickening throughout the colon. This is most pronounced in the sigmoid colon, splenic flexure, mid transverse colon and RIGHT hemicolon. Findings likely reflect history of inflammatory colitis. 2. Favored underlying hepatic steatosis. 3. Heterogeneous enhancement of the uterus with multiple prominent parametrial vessels. This is nonspecific but can be seen in the setting of pelvic congestion syndrome. Electronically Signed   By: Clancy Crimes M.D.   On: 02/24/2024 17:28    Procedures Procedures    Medications Ordered in ED Medications  sodium chloride  0.9 %  bolus 1,000 mL (0 mLs Intravenous Stopped 02/24/24 1805)  ondansetron  (ZOFRAN ) injection 4 mg (4 mg Intravenous Given 02/24/24 1622)  iohexol  (OMNIPAQUE ) 300 MG/ML solution 100 mL (100 mLs Intravenous Contrast Given 02/24/24 1709)  potassium chloride  SA (KLOR-CON  M) CR tablet 40 mEq (40 mEq Oral Given 02/24/24 1757)  sodium chloride  0.9 % bolus 1,000 mL (1,000 mLs Intravenous New Bag/Given 02/24/24 1832)    ED Course/ Medical Decision Making/ A&P Clinical Course as of 02/24/24 1841  Sat Feb 24, 2024  1807 Mcgreal Chester Costa [CP]    Clinical Course User Index [CP] Nelly Banco, PA-C                                 Medical Decision Making Amount and/or Complexity of Data Reviewed Labs: ordered. Radiology: ordered.  Risk Prescription drug management.   This patient is a 58 y.o. female  who presents to the ED for concern of abdominal pain,  syncope.   Differential diagnoses prior to evaluation: The emergent differential diagnosis includes, but is not limited to,  The causes of generalized abdominal pain include but are not limited to AAA, mesenteric ischemia, appendicitis, diverticulitis, DKA, gastritis, gastroenteritis, AMI, nephrolithiasis, pancreatitis, peritonitis, adrenal insufficiency,lead poisoning, iron  toxicity, intestinal ischemia, constipation, UTI,SBO/LBO, splenic rupture, biliary disease, IBD, IBS, PUD, or hepatitis, CVA, ACS, arrhythmia, vasovagal / orthostatic hypotension, sepsis, hypoglycemia, electrolyte disturbance, respiratory failure, anemia, dehydration, heat injury, polypharmacy, malignancy, anxiety/panic attack . This is not an exhaustive differential.   Past Medical History / Co-morbidities / Social History: Ulcerative colitis, previous cholecystectomy, previous tubal ligation, notably patient is Jehovah's Witness, cannot receive blood products  Additional history: Chart reviewed. Pertinent results include: Reviewed lab work, imaging from previous outpatient GI visits, she normally sees Dr. [name]  Physical Exam: Physical exam performed. The pertinent findings include: Some focal tenderness in the left lower quadrant, no rebound, rigidity, guarding throughout.  No abdominal distention.  She has been persistently tachycardic in the ED, initially with pulse around 106, but pulse worsened to around 120 on reevaluation, initially normotensive but has become slightly hypertensive, blood pressure 143/74.  Lab Tests/Imaging studies: I personally interpreted labs/imaging and the pertinent results include: Fairly significant leukocytosis, white blood cell 16.7, CMP with hypokalemia, we will orally replete, UA with moderate hemoglobin, otherwise unremarkable, lipase within normal limits.  I independently read CT abdomen pelvis with contrast which shows diffuse colon wall thickening especially in the sigmoid colon consistent  with infectious/inflammatory colitis.  In context of her UC suspect UC flare. I agree with the radiologist interpretation.  Cardiac monitoring: EKG obtained and interpreted by myself and attending physician which shows: Sinus tachycardia, right bundle branch block   Medications: I ordered medication including fluid bolus, potassium, Zofran  for nausea.  I have reviewed the patients home medicines and have made adjustments as needed.  Consult: Spoke with Dr. Yvone Herd with  GI who recommends that we obtain stool studies prior to beginning steroids or antibiotics on the patient, will consult, recommends medical admission.  Given her persistent tachycardia, suspected UC flare, and significant inflammation on CT along with leukocytosis I think admission is the appropriate disposition for this patient, spoke with the hospitalist, who agrees to plan for admission at this time.   Disposition: After consideration of the diagnostic results and the patients response to treatment, I feel that patient would benefit from admission as discussed above..   Final Clinical Impression(s) / ED Diagnoses  Final diagnoses:  None    Rx / DC Orders ED Discharge Orders     None         Stefan Edge 02/24/24 1841    Jerilynn Montenegro, MD 02/24/24 2111

## 2024-02-24 NOTE — ED Triage Notes (Signed)
 Per EMS from home. BLQ and lower back pain this morning with 4 episodes of runny stools, vomiting and a syncopal episode. C/o being cold. Mild cut on right index finger.  HR 110 BP 118/77 97 on RA CB 113

## 2024-02-24 NOTE — Consult Note (Addendum)
 Inpatient Consultation   Referring Provider:      Primary Care Physician:  [name], MD Primary Gastroenterologist:     Dr. Alvis Jourdain    Reason for Consultation:     Abdominal pain, nausea, vomiting, diarrhea and colonic wall thickening on CT in the setting of ulcerative colitis         HPI  Kelli Whitaker is a 58 y.o. female with a past medical history noteworthy for anemia and ulcerative colitis diagnosed in 2001 on maintenance oral and rectal mesalamine and azathioprine azathioprine admitted to the hospital with a 1 day history of abrupt onset abdominal pain, nausea, vomiting, diarrhea.  Ms. Conlee states that her ulcerative colitis has been in long-term clinical and endoscopic remission based upon colonoscopy performed last year.  She has been compliant with her medical therapy.  This morning she developed the abrupt onset of abdominal pain and low back pain.  After that, she began to feel cold and developed symptoms of nausea, nonbloody, nonbilious emesis as well as loose, watery, nonbloody stool.  Her symptoms progressed throughout the day prompting her to feel lightheaded and dizzy and passing out.  Her daughter subsequently brought her to the emergency department.  She denies recent travel, ill contacts, eating out at restaurants or antibiotics States that her symptoms do not feel consistent with a typical ulcerative colitis flare -has not had blood in her stool No extraintestinal manifestations of IBD States that she had a recent cut on her finger from rusty metal  ED course: Afebrile, tachycardic 100-129; BP 120-140/70-80 Labs: WBC 16.7, Hgb 12.8, HCT 40.4, PLT 343 CMP and lipase normal CTAP: Multiple areas of circumferential bowel wall thickening throughout the colon, most pronounced in the sigmoid colon, splenic flexure, mid transverse colon and right hemicolon; hepatic steatosis; possible findings of pelvic congestion syndrome   Past Medical History:   Diagnosis Date   Anemia 11/2013   Chest pain 11/2013   Family history of anesthesia complication    ' My Brother went into cardiac arrest "   Refusal of blood transfusions as patient is Jehovah's Witness    Ulcerative colitis (HCC)     Past Surgical History:  Procedure Laterality Date   CHOLECYSTECTOMY     TONSILLECTOMY     TUBAL LIGATION       Social History   Tobacco Use   Smoking status: Never   Smokeless tobacco: Never  Substance Use Topics   Alcohol use: No   Drug use: No    Prior to Admission medications   Medication Sig Start Date End Date Taking? Authorizing Provider  azaTHIOprine (IMURAN) 50 MG tablet Take 150 mg by mouth daily.    [provider]  benzonatate  (TESSALON ) 100 MG capsule Take 1 capsule (100 mg total) by mouth 3 (three) times daily as needed for cough. 11/03/18   Fawze, Mina A, PA-C  cyclobenzaprine  (FLEXERIL ) 10 MG tablet Take 1 tablet (10 mg total) by mouth 2 (two) times daily as needed for muscle spasms. 12/22/18   Debbra Fairy, PA-C  IRON  PO Take 1 tablet by mouth daily.    [provider]  mesalamine (CANASA) 1000 MG suppository Place 1,000 mg rectally at bedtime.    [provider]  Mesalamine (LIALDA PO) Take 2 tablets by mouth daily.    [provider]  Multiple Vitamins-Minerals (MULTIVITAL PO) Take 1 tablet by mouth daily.    [provider]  ondansetron  (ZOFRAN  ODT) 4 MG disintegrating tablet Take 1  tablet (4 mg total) by mouth every 8 (eight) hours as needed for nausea or vomiting. 11/03/18   Kandra Orn A, PA-C  oxyCODONE -acetaminophen  (PERCOCET/ROXICET) 5-325 MG tablet Take 1 tablet by mouth every 6 (six) hours as needed for severe pain. 12/22/18   Debbra Fairy, PA-C    No current facility-administered medications for this encounter.   Current Outpatient Medications  Medication Sig Dispense Refill   azaTHIOprine (IMURAN) 50 MG tablet Take 150 mg by mouth daily.     benzonatate  (TESSALON ) 100 MG  capsule Take 1 capsule (100 mg total) by mouth 3 (three) times daily as needed for cough. 21 capsule 0   cyclobenzaprine  (FLEXERIL ) 10 MG tablet Take 1 tablet (10 mg total) by mouth 2 (two) times daily as needed for muscle spasms. 20 tablet 0   IRON  PO Take 1 tablet by mouth daily.     mesalamine (CANASA) 1000 MG suppository Place 1,000 mg rectally at bedtime.     Mesalamine (LIALDA PO) Take 2 tablets by mouth daily.     Multiple Vitamins-Minerals (MULTIVITAL PO) Take 1 tablet by mouth daily.     ondansetron  (ZOFRAN  ODT) 4 MG disintegrating tablet Take 1 tablet (4 mg total) by mouth every 8 (eight) hours as needed for nausea or vomiting. 10 tablet 0   oxyCODONE -acetaminophen  (PERCOCET/ROXICET) 5-325 MG tablet Take 1 tablet by mouth every 6 (six) hours as needed for severe pain. 8 tablet 0    Allergies as of 02/24/2024 - Reviewed 02/24/2024  Allergen Reaction Noted   Prednisone Other (See Comments) 06/11/2013    GI Review of Symptoms Significant for abdominal pain, nausea, vomiting, diarrhea. Otherwise negative.  General Review of Systems  Review of systems is significant for the pertinent positives and negatives as listed per the HPI.  Full ROS is otherwise negative.    Physical Exam  Vital signs in last 24 hours: Temp:  [98.6 F (37 C)] 98.6 F (37 C) (05/17 1839) Pulse Rate:  [106-129] 129 (05/17 1839) Resp:  [17-22] 18 (05/17 1839) BP: (128-143)/(73-83) 128/73 (05/17 1839) SpO2:  [98 %-100 %] 98 % (05/17 1839)   General:  NAD, Well developed, Well nourished, alert and cooperative Head:  Normocephalic and atraumatic. Eyes:   PEERL, EOMI. No icterus. Conjunctiva pink. Lungs: Respirations even and unlabored. Lungs clear to auscultation bilaterally.   No wheezes, crackles, or rhonchi.  Heart: Normal S1, S2. No MRG. Regular rate and rhythm. No peripheral edema, cyanosis or pallor.  Abdomen:  Soft, nondistended, nontender, no rebound or guarding. Normal bowel sounds. No appreciable  masses or hepatomegaly. Rectal:  Deferred  Msk:  Symmetrical without gross deformities. Peripheral pulses intact.    Lab Results Recent Labs    02/24/24 1623  WBC 16.7*  HGB 12.8  HCT 40.4  PLT 343   BMET Recent Labs    02/24/24 1623  NA 137  K 3.1*  CL 104  CO2 22  GLUCOSE 88  BUN 11  CREATININE 0.66  CALCIUM 8.6*   LFT Recent Labs    02/24/24 1623  PROT 6.9  ALBUMIN 3.6  AST 19  ALT 19  ALKPHOS 48  BILITOT 1.2   PT/INR No results for input(s): "LABPROT", "INR" in the last 72 hours.  Radiographic Studies CT ABDOMEN PELVIS W CONTRAST Result Date: 02/24/2024 CLINICAL DATA:  Abdominal pain, acute, nonlocalized. History of ulcerative colitis. EXAM: CT ABDOMEN AND PELVIS WITH CONTRAST TECHNIQUE: Multidetector CT imaging of the abdomen and pelvis was performed using the standard protocol following bolus  administration of intravenous contrast. RADIATION DOSE REDUCTION: This exam was performed according to the departmental dose-optimization program which includes automated exposure control, adjustment of the mA and/or kV according to patient size and/or use of iterative reconstruction technique. 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 RIGHT lower lobe atelectasis. Hepatobiliary: Liver is diffusely hypoattenuating. Status post cholecystectomy. Pancreas: Unremarkable. No pancreatic ductal dilatation or surrounding inflammatory changes. Spleen: Unremarkable. Adrenals/Urinary Tract: Adrenal glands are unremarkable. Kidneys enhance symmetrically. Bilateral parapelvic cysts and extrarenal pelves. LEFT-sided extrarenal pelvis is mildly increased in size compared to remote prior with no etiology for obstruction identified. Bladder is unremarkable. Stomach/Bowel: No evidence of bowel obstruction. There are multiple areas of circumferential bowel wall thickening or featureless areas  throughout the colon. This is most pronounced in the sigmoid colon, splenic flexure, mid transverse colon and RIGHT hemicolon. Small hiatal hernia. Mild gastric fold prominence of the decompressed stomach. Vascular/Lymphatic: No significant vascular findings are present. Similar prominent LEFT periaortic retroperitoneal lymph nodes with representative LEFT periaortic lymph node measuring 7 mm in the short axis, unchanged (series 2, image 39). Reproductive: Heterogeneous enhancement of the uterus of multiple prominent parametrial vessels. Other: No free air or free fluid. Musculoskeletal: LEFT iliac bone island. Degenerative changes of the lower lumbar spine. IMPRESSION: 1. There are multiple areas of circumferential bowel wall thickening throughout the colon. This is most pronounced in the sigmoid colon, splenic flexure, mid transverse colon and RIGHT hemicolon. Findings likely reflect history of inflammatory colitis. 2. Favored underlying hepatic steatosis. 3. Heterogeneous enhancement of the uterus with multiple prominent parametrial vessels. This is nonspecific but can be seen in the setting of pelvic congestion syndrome. Electronically Signed   By: Clancy Crimes M.D.   On: 02/24/2024 17:28    Endoscopic Studies     Patient reports having a normal colonoscopy last year showing endoscopic remission of disease-report not availab   Clinical Impression   It is my clinical impression that Ms. Hauth is a 58 y.o. female with a past medical history noteworthy for anemia and ulcerative colitis diagnosed in 2001 on maintenance oral and rectal mesalamine and azathioprine azathioprine admitted to the hospital with a 1 day history of abrupt onset abdominal pain, nausea, vomiting, diarrhea.  Laboratory studies are noteworthy for leukocytosis.  CT scan shows evidence of bowel wall thickening in the colon.  She does not feel her symptoms are consistent with a typical ulcerative colitis flare.  We discussed that  the abrupt onset of her symptoms in the setting of leukocytosis may indicate an enteric infection.  I would recommend supportive care and conservative management before considering the possibility of introducing steroids for ulcerative colitis.   Plan  Obtain stool studies for bacterial culture, C. difficile and parasites, fecal calprotectin ESR and CRP in AM Maintenance IV fluids Patient may take regular diet as tolerated Antiemetics as needed as appropriate Continue maintenance Lialda 2.4 g daily and azathioprine 150 mg p.o. daily Monitor stool output and abdominal exam Recheck CBC tomorrow to trend leukocytosis If stool studies are negative and GI symptoms are not improving may consider possibility of initiating steroids for possible UC flare.  Thank you for your kind consultation, we will continue to follow.  Scarlette Currier Desmin Daleo  02/24/2024, 7:09 PM  Eugenia Hess, MD [facility]

## 2024-02-24 NOTE — H&P (Addendum)
 History and Physical  Kelli Whitaker MVH:[id number] DOB: [birth year] DOA: 02/24/2024  PCP: [name], MD   Chief Complaint: Abdominal pain, nausea and vomiting  HPI: Kelli Whitaker is a 58 y.o. female with medical history significant for ulcerative colitis and anemia who presented to the ED for evaluation of abdominal pain, nausea and vomiting. Patient reports that earlier today, she started having lower abdominal pain that radiated to her low back. This was followed by nausea and vomiting x 3. She also reports multiple trips to the bathroom for bowel movement that was slightly formed and nonbloody. She had associated shivering and chills. She started feeling lightheaded and while attempting to go to the restroom, she had another episode of nonbloody, nonbilious emesis and passed out.  She woke up on the floor next to her daughter with headache and nausea.  Reports that this episode is different from her UC flares in the past.  She denies eating any outside/restaurant food over the last few days or foods that have been sitting out of the fridge.  He denies any fevers, shortness of breath, bloody stools, constipation, dysuria, palpitations or hematuria.  ED Course: Initial vitals showed patient afebrile, tachycardic 100-129; BP 120-140/70-80. Labs significant for WBC 16.7, Hgb 12.8, HCT 40.4, PLT 343, K+ 3.1, normal kidney function, LFTs and lipase. EKG shows sinus tach with RBBB.  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 Patient received KCl 40 mcg x 1, IV Zofran  4 mg x 1 and IV NS 1 L bolus x 2. GI was consulted for evaluation. TRH was consulted for admission.  Review of Systems: Please see HPI for pertinent positives and negatives. A complete 10 system review of systems are otherwise  negative.  Past Medical History:  Diagnosis Date   Anemia 11/2013   Chest pain 11/2013   Family history of anesthesia complication    ' My Brother went into cardiac arrest "   Refusal of blood transfusions as patient is Jehovah's Witness    Ulcerative colitis (HCC)    Past Surgical History:  Procedure Laterality Date   CHOLECYSTECTOMY     TONSILLECTOMY     TUBAL LIGATION     Social History:  reports that she has never smoked. She has never used smokeless tobacco. She reports that she does not drink alcohol and does not use drugs.  Allergies  Allergen Reactions   Prednisone Other (See Comments)    Abdominal pain     No family history on file.   Prior to Admission medications   Medication Sig Start Date End Date Taking? Authorizing Provider  azaTHIOprine (IMURAN) 50 MG tablet Take 150 mg by mouth daily.    [provider]  benzonatate  (TESSALON ) 100 MG capsule Take 1 capsule (100 mg total) by mouth 3 (three) times daily as needed for cough. 11/03/18   Fawze, Mina A, PA-C  cyclobenzaprine  (FLEXERIL ) 10 MG tablet Take 1 tablet (10 mg total) by mouth 2 (two) times daily as needed for muscle spasms. 12/22/18   Debbra Fairy, PA-C  IRON  PO Take 1 tablet by mouth daily.    [provider]  mesalamine (CANASA) 1000 MG suppository Place 1,000 mg rectally at bedtime.    [provider]  Mesalamine (LIALDA PO) Take 2 tablets by mouth daily.    [provider]  Multiple Vitamins-Minerals (MULTIVITAL PO)  Take 1 tablet by mouth daily.    [provider]  ondansetron  (ZOFRAN  ODT) 4 MG disintegrating tablet Take 1 tablet (4 mg total) by mouth every 8 (eight) hours as needed for nausea or vomiting. 11/03/18   Alvira Azure, Mina A, PA-C  oxyCODONE -acetaminophen  (PERCOCET/ROXICET) 5-325 MG tablet Take 1 tablet by mouth every 6 (six) hours as needed for severe pain. 12/22/18   Debbra Fairy, PA-C    Physical Exam: BP 118/76 (BP Location: Right Arm)   Pulse (!) 116    Temp 99.8 F (37.7 C) (Oral)   Resp 16   LMP 09/03/2018 (Approximate)   SpO2 98%  General: Pleasant, sick appearing elderly woman laying in bed. No acute distress. HEENT: [place]/AT. Anicteric sclera. Dry mucous membrane. CV: Tachycardic.  ED regular rhythm. No murmurs, rubs, or gallops. No LE edema Pulmonary: Lungs CTAB. Normal effort. No wheezing or rales. Abdominal: Soft, nondistended. Mild ttp of the lower abdomen, worse in the LLQ. Normal bowel sounds. Extremities: Palpable radial and DP pulses. Normal ROM. Skin: Warm and dry. No obvious rash or lesions. Neuro: A&Ox3. Moves all extremities. Normal sensation to light touch. No focal deficit. Psych: Normal mood and affect          Labs on Admission:  Basic Metabolic Panel: Recent Labs  Lab 02/24/24 1623  NA 137  K 3.1*  CL 104  CO2 22  GLUCOSE 88  BUN 11  CREATININE 0.66  CALCIUM 8.6*   Liver Function Tests: Recent Labs  Lab 02/24/24 1623  AST 19  ALT 19  ALKPHOS 48  BILITOT 1.2  PROT 6.9  ALBUMIN 3.6   Recent Labs  Lab 02/24/24 1623  LIPASE 34   No results for input(s): "AMMONIA" in the last 168 hours. CBC: Recent Labs  Lab 02/24/24 1623  WBC 16.7*  HGB 12.8  HCT 40.4  MCV 95.1  PLT 343   Cardiac Enzymes: No results for input(s): "CKTOTAL", "CKMB", "CKMBINDEX", "TROPONINI" in the last 168 hours. BNP (last 3 results) No results for input(s): "BNP" in the last 8760 hours.  ProBNP (last 3 results) No results for input(s): "PROBNP" in the last 8760 hours.  CBG: No results for input(s): "GLUCAP" in the last 168 hours.  Radiological Exams on Admission: CT ABDOMEN PELVIS W CONTRAST Result Date: 02/24/2024 CLINICAL DATA:  Abdominal pain, acute, nonlocalized. History of ulcerative colitis. EXAM: CT ABDOMEN AND PELVIS WITH CONTRAST TECHNIQUE: Multidetector CT imaging of the abdomen and pelvis was performed using the standard protocol following bolus administration of intravenous contrast. RADIATION DOSE  REDUCTION: This exam was performed according to the departmental dose-optimization program which includes automated exposure control, adjustment of the mA and/or kV according to patient size and/or use of iterative reconstruction technique. 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 RIGHT lower lobe atelectasis. Hepatobiliary: Liver is diffusely hypoattenuating. Status post cholecystectomy. Pancreas: Unremarkable. No pancreatic ductal dilatation or surrounding inflammatory changes. Spleen: Unremarkable. Adrenals/Urinary Tract: Adrenal glands are unremarkable. Kidneys enhance symmetrically. Bilateral parapelvic cysts and extrarenal pelves. LEFT-sided extrarenal pelvis is mildly increased in size compared to remote prior with no etiology for obstruction identified. Bladder is unremarkable. Stomach/Bowel: No evidence of bowel obstruction. There are multiple areas of circumferential bowel wall thickening or featureless areas throughout the colon. This is most pronounced in the sigmoid colon, splenic flexure, mid transverse colon and RIGHT hemicolon. Small hiatal hernia. Mild gastric fold prominence of the decompressed stomach.  Vascular/Lymphatic: No significant vascular findings are present. Similar prominent LEFT periaortic retroperitoneal lymph nodes with representative LEFT periaortic lymph node measuring 7 mm in the short axis, unchanged (series 2, image 39). Reproductive: Heterogeneous enhancement of the uterus of multiple prominent parametrial vessels. Other: No free air or free fluid. Musculoskeletal: LEFT iliac bone island. Degenerative changes of the lower lumbar spine. IMPRESSION: 1. There are multiple areas of circumferential bowel wall thickening throughout the colon. This is most pronounced in the sigmoid colon, splenic flexure, mid transverse colon and RIGHT hemicolon. Findings likely reflect history  of inflammatory colitis. 2. Favored underlying hepatic steatosis. 3. Heterogeneous enhancement of the uterus with multiple prominent parametrial vessels. This is nonspecific but can be seen in the setting of pelvic congestion syndrome. Electronically Signed   By: Clancy Crimes M.D.   On: 02/24/2024 17:28   Assessment/Plan Kelli Whitaker is a 58 y.o. female with medical history significant for ulcerative colitis and anemia who presented to the ED for evaluation of abdominal pain, nausea and vomiting and admitted for further evaluation.   # Abdominal pain, N/V # Dehydration - Pt w/ hx of UC p/w abrupt onset of lower abdominal pain, nausea, vomiting and diarrhea completed by syncope episode at home due to dehydration. - She is tachycardic but hemodynamically stable with SBP in the 110s to 140s - CT A/P showing diffuse inflammation throughout the colon - Differential includes acute gastroenteritis versus UC flare - She has leukocytosis but afebrile and without any recent travels, ill contacts, antibiotics exposure or eating at restaurants - Per patient, this does not feel like her UC symptoms and she had nonbloody stools. - Will admit for observation and close monitoring - GI consulted, recommends conservative management and rule out enteric infection with stool studies - May initiate steroids for possible UC flare if stool studies are negative - IV NS at 100 cc/h for 1 day - Pain control with as needed Percocet and IV Dilaudid - Follow-up GI panel, C. difficile quick screen and stool culture - Trend CBC, fever curve  # Ulcerative colitis - Diagnosed in 2001, currently on maintenance mesalamine and azathioprine - Normally has 1 BM a day - Continue mesalamine and azathioprine - GI following, will start steroids for UC flare if stool studies are negative  # Hypokalemia - K+ 3.1 on admission in the setting of acute nausea and vomiting - S/p KCl 40 mcg x 1 in the ED - Follow-up morning  CMP, mag and Phos  # Tachycardia - Found to be tachycardic with HR in 100s to 120s - This is in the setting of dehydration from repeated nausea and vomiting - Continue IV hydration as above   DVT prophylaxis: Lovenox     Code Status: Full Code  Consults called: GI  Family Communication: Discussed admission with grandsons at bedside  Severity of Illness: The appropriate patient status for this patient is OBSERVATION. Observation status is judged to be reasonable and necessary in order to provide the required intensity of service to ensure the patient's safety. The patient's presenting symptoms, physical exam findings, and initial radiographic and laboratory data in the context of their medical condition is felt to place them at decreased risk for further clinical deterioration. Furthermore, it is anticipated that the patient will be medically stable for discharge from the hospital within 2 midnights of admission.   Level of care: Med-Surg   This record has been created using Conservation officer, historic buildings. Errors have been sought and corrected, but may not  always be located. Such creation errors do not reflect on the standard of care.   Vita Grip, MD 02/24/2024, 7:43 PM [facility] Pager: [phone] Isaiah 41:10   If 7PM-7AM, please contact night-coverage [email or web address] Password [email or web address]

## 2024-02-25 ENCOUNTER — Encounter (HOSPITAL_COMMUNITY): Payer: Self-pay | Admitting: Student

## 2024-02-25 ENCOUNTER — Other Ambulatory Visit: Payer: Self-pay

## 2024-02-25 DIAGNOSIS — K5190 Ulcerative colitis, unspecified, without complications: Secondary | ICD-10-CM | POA: Diagnosis not present

## 2024-02-25 DIAGNOSIS — K529 Noninfective gastroenteritis and colitis, unspecified: Secondary | ICD-10-CM | POA: Diagnosis not present

## 2024-02-25 LAB — COMPREHENSIVE METABOLIC PANEL WITH GFR
ALT: 16 U/L (ref 0–44)
AST: 13 U/L — ABNORMAL LOW (ref 15–41)
Albumin: 3.1 g/dL — ABNORMAL LOW (ref 3.5–5.0)
Alkaline Phosphatase: 45 U/L (ref 38–126)
Anion gap: 6 (ref 5–15)
BUN: 8 mg/dL (ref 6–20)
CO2: 23 mmol/L (ref 22–32)
Calcium: 8.3 mg/dL — ABNORMAL LOW (ref 8.9–10.3)
Chloride: 107 mmol/L (ref 98–111)
Creatinine, Ser: 0.54 mg/dL (ref 0.44–1.00)
GFR, Estimated: >60 mL/min (ref >60)
Glucose, Bld: 103 mg/dL — ABNORMAL HIGH (ref 70–99)
Potassium: 3.4 mmol/L — ABNORMAL LOW (ref 3.5–5.1)
Sodium: 136 mmol/L (ref 135–145)
Total Bilirubin: 1.7 mg/dL — ABNORMAL HIGH (ref 0.0–1.2)
Total Protein: 6.6 g/dL (ref 6.5–8.1)

## 2024-02-25 LAB — SEDIMENTATION RATE: Sed Rate: 22 mm/h (ref 0–22)

## 2024-02-25 LAB — CBC
HCT: 38.1 % (ref 36.0–46.0)
Hemoglobin: 12 g/dL (ref 12.0–15.0)
MCH: 29.9 pg (ref 26.0–34.0)
MCHC: 31.5 g/dL (ref 30.0–36.0)
MCV: 94.8 fL (ref 80.0–100.0)
Platelets: 341 10*3/uL (ref 150–400)
RBC: 4.02 MIL/uL (ref 3.87–5.11)
RDW: 14.6 % (ref 11.5–15.5)
WBC: 16.7 10*3/uL — ABNORMAL HIGH (ref 4.0–10.5)
nRBC: 0 % (ref 0.0–0.2)

## 2024-02-25 LAB — MAGNESIUM: Magnesium: 2 mg/dL (ref 1.7–2.4)

## 2024-02-25 LAB — HIV ANTIBODY (ROUTINE TESTING W REFLEX): HIV Screen 4th Generation wRfx: NONREACTIVE

## 2024-02-25 LAB — PHOSPHORUS: Phosphorus: 3.3 mg/dL (ref 2.5–4.6)

## 2024-02-25 LAB — C-REACTIVE PROTEIN: CRP: 10.7 mg/dL — ABNORMAL HIGH (ref <1.0)

## 2024-02-25 MED ORDER — POTASSIUM CHLORIDE CRYS ER 20 MEQ PO TBCR
40.0000 meq | EXTENDED_RELEASE_TABLET | Freq: Once | ORAL | Status: AC
  Administered 2024-02-25: 40 meq via ORAL
  Filled 2024-02-25: qty 2

## 2024-02-25 MED ORDER — ONDANSETRON HCL 4 MG PO TABS: 4.0000 mg | ORAL_TABLET | Freq: Four times a day (QID) | ORAL | 0 refills | Status: DC | PRN

## 2024-02-25 MED ORDER — TRAMADOL HCL 50 MG PO TABS: 50.0000 mg | ORAL_TABLET | Freq: Four times a day (QID) | ORAL | 0 refills | Status: DC | PRN

## 2024-02-25 NOTE — Progress Notes (Signed)
 PROGRESS NOTE    Kelli Whitaker  WUJ:[id number] DOB: [birth year] DOA: 02/24/2024 PCP: [name], MD   Brief Narrative:   58 y.o. female with medical history significant for ulcerative colitis and hypertension presented with worsening abdominal pain, nausea and vomiting with diarrhea.  On presentation, she was tachycardic, afebrile.  WBC was 16.7, potassium of 3.1, normal kidney function, LFTs and lipase.  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  She was started on IV fluids.  GI was consulted.   Assessment & Plan:   Abdominal pain with nausea and vomiting and diarrhea Dehydration - Imaging as above.   -She was started on IV fluids.  GI was consulted.  -Stool for C. difficile negative. - Symptoms improving.  Follow further GI recommendations.  Continue analgesics and antiemetics as needed.  Ulcerative colitis - Continue mesalamine and azathioprine.  Outpatient follow-up with GI   Leukocytosis - Questionable cause.  Still has significant leukocytosis.  Monitor  Hypokalemia - Replace.  Repeat a.m. labs.   Hypertension - Blood pressure currently on the lower side.  Hold amlodipine till reevaluation by PCP     DVT prophylaxis: Lovenox Code Status: Full Family Communication: Daughter at bedside Disposition Plan: Status is: Observation The patient will require care spanning > 2 midnights and should be moved to inpatient because: Of severity of illness.    Consultants: GI  Procedures: None  Antimicrobials: None   Subjective: Patient seen and examined at bedside.  Feels like better.  Denies any worsening abdominal pain.  Nausea is improving.  Had mild fever this morning.  Objective: Vitals:   02/24/24 2125 02/25/24 0122 02/25/24 0553  02/25/24 0946  BP: 114/77 113/65 111/69 107/72  Pulse: (!) 108 96 98 88  Resp: 16 16 16 18   Temp: 98.7 F (37.1 C) 98.4 F (36.9 C) 100 F (37.8 C) 98.4 F (36.9 C)  TempSrc: Oral Oral Oral Oral  SpO2: 97% 97% 96% 96%    Intake/Output Summary (Last 24 hours) at 02/25/2024 1137 Last data filed at 02/25/2024 1003 Gross per 24 hour  Intake 1279 ml  Output --  Net 1279 ml   There were no vitals filed for this visit.  Examination:  General exam: Appears calm and comfortable.  On room air. Respiratory system: Bilateral decreased breath sounds at bases, no wheezing Cardiovascular system: S1 & S2 heard, Rate controlled Gastrointestinal system: Abdomen is nondistended, soft and mildly tender. Normal bowel sounds heard. Extremities: No cyanosis, clubbing, edema  Central nervous system: Alert and oriented. No focal neurological deficits. Moving extremities Skin: No rashes, lesions or ulcers Psychiatry: Mostly flat affect.  Not agitated.    Data Reviewed: I have personally reviewed following labs and imaging studies  CBC: Recent Labs  Lab 02/24/24 1623 02/25/24 0534  WBC 16.7* 16.7*  HGB 12.8 12.0  HCT 40.4 38.1  MCV 95.1 94.8  PLT 343 341   Basic Metabolic Panel: Recent Labs  Lab 02/24/24 1623 02/25/24 0534  NA 137 136  K 3.1* 3.4*  CL 104 107  CO2 22 23  GLUCOSE 88 103*  BUN 11 8  CREATININE 0.66 0.54  CALCIUM 8.6* 8.3*  MG  --  2.0  PHOS  --  3.3   GFR: CrCl cannot be calculated (Unknown ideal weight.). Liver Function Tests: Recent Labs  Lab 02/24/24 1623  02/25/24 0534  AST 19 13*  ALT 19 16  ALKPHOS 48 45  BILITOT 1.2 1.7*  PROT 6.9 6.6  ALBUMIN 3.6 3.1*   Recent Labs  Lab 02/24/24 1623  LIPASE 34   No results for input(s): "AMMONIA" in the last 168 hours. Coagulation Profile: No results for input(s): "INR", "PROTIME" in the last 168 hours. Cardiac Enzymes: No results for input(s): "CKTOTAL", "CKMB", "CKMBINDEX", "TROPONINI" in the last 168  hours. BNP (last 3 results) No results for input(s): "PROBNP" in the last 8760 hours. HbA1C: No results for input(s): "HGBA1C" in the last 72 hours. CBG: No results for input(s): "GLUCAP" in the last 168 hours. Lipid Profile: No results for input(s): "CHOL", "HDL", "LDLCALC", "TRIG", "CHOLHDL", "LDLDIRECT" in the last 72 hours. Thyroid Function Tests: No results for input(s): "TSH", "T4TOTAL", "FREET4", "T3FREE", "THYROIDAB" in the last 72 hours. Anemia Panel: No results for input(s): "VITAMINB12", "FOLATE", "FERRITIN", "TIBC", "IRON ", "RETICCTPCT" in the last 72 hours. Sepsis Labs: No results for input(s): "PROCALCITON", "LATICACIDVEN" in the last 168 hours.  Recent Results (from the past 240 hours)  C Difficile Quick Screen w PCR reflex     Status: None   Collection Time: 02/24/24  8:52 PM   Specimen: Stool  Result Value Ref Range Status   C Diff antigen NEGATIVE NEGATIVE Final   C Diff toxin NEGATIVE NEGATIVE Final   C Diff interpretation No C. difficile detected.  Final    Comment: Performed at [facility], [address] [place], [place] [address]         Radiology Studies: CT ABDOMEN PELVIS W CONTRAST Result Date: 02/24/2024 CLINICAL DATA:  Abdominal pain, acute, nonlocalized. History of ulcerative colitis. EXAM: CT ABDOMEN AND PELVIS WITH CONTRAST TECHNIQUE: Multidetector CT imaging of the abdomen and pelvis was performed using the standard protocol following bolus administration of intravenous contrast. RADIATION DOSE REDUCTION: This exam was performed according to the departmental dose-optimization program which includes automated exposure control, adjustment of the mA and/or kV according to patient size and/or use of iterative reconstruction technique. 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 RIGHT lower lobe atelectasis. Hepatobiliary: Liver is  diffusely hypoattenuating. Status post cholecystectomy. Pancreas: Unremarkable. No pancreatic ductal dilatation or surrounding inflammatory changes. Spleen: Unremarkable. Adrenals/Urinary Tract: Adrenal glands are unremarkable. Kidneys enhance symmetrically. Bilateral parapelvic cysts and extrarenal pelves. LEFT-sided extrarenal pelvis is mildly increased in size compared to remote prior with no etiology for obstruction identified. Bladder is unremarkable. Stomach/Bowel: No evidence of bowel obstruction. There are multiple areas of circumferential bowel wall thickening or featureless areas throughout the colon. This is most pronounced in the sigmoid colon, splenic flexure, mid transverse colon and RIGHT hemicolon. Small hiatal hernia. Mild gastric fold prominence of the decompressed stomach. Vascular/Lymphatic: No significant vascular findings are present. Similar prominent LEFT periaortic retroperitoneal lymph nodes with representative LEFT periaortic lymph node measuring 7 mm in the short axis, unchanged (series 2, image 39). Reproductive: Heterogeneous enhancement of the uterus of multiple prominent parametrial vessels. Other: No free air or free fluid. Musculoskeletal: LEFT iliac bone island. Degenerative changes of the lower lumbar spine. IMPRESSION: 1. There are multiple areas of circumferential bowel wall thickening throughout the colon. This is most pronounced in the sigmoid colon, splenic flexure, mid transverse colon and RIGHT hemicolon. Findings likely reflect history of inflammatory colitis. 2. Favored underlying hepatic steatosis. 3. Heterogeneous enhancement of the uterus with multiple prominent parametrial  vessels. This is nonspecific but can be seen in the setting of pelvic congestion syndrome. Electronically Signed   By: Clancy Crimes M.D.   On: 02/24/2024 17:28        Scheduled Meds:  azaTHIOprine  150 mg Oral Daily   enoxaparin (LOVENOX) injection  40 mg Subcutaneous Q24H    mesalamine  1,000 mg Rectal QHS   mesalamine  2.4 g Oral Daily   Continuous Infusions:  sodium chloride  100 mL/hr at 02/24/24 2003          Audria Leather, MD [facility] 02/25/2024, 11:37 AM

## 2024-02-25 NOTE — Plan of Care (Signed)
 ?  Problem: Clinical Measurements: ?Goal: Will remain free from infection ?Outcome: Progressing ?  ?

## 2024-02-25 NOTE — Discharge Summary (Signed)
 Physician Discharge Summary  Kelli Whitaker UYQ:[id number] DOB: 1966/06/15 DOA: 02/24/2024  PCP: [name], MD  Admit date: 02/24/2024 Discharge date: 02/26/2024  Admitted From: Home Disposition: Home  Recommendations for Outpatient Follow-up:  Follow up with PCP in 1 week with repeat CBC/BMP Outpatient follow-up with GI/Dr. [name] Follow up in ED if symptoms worsen or new appear   Home Health: No Equipment/Devices: None  Discharge Condition: Stable CODE STATUS: Full Diet recommendation: Regular  Brief/Interim Summary:  58 y.o. female with medical history significant for ulcerative colitis and hypertension presented with worsening abdominal pain, nausea and vomiting with diarrhea.  On presentation, she was tachycardic, afebrile.  WBC was 16.7, potassium of 3.1, normal kidney function, LFTs and lipase.  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  She was started on IV fluids.  GI was consulted.  Stool for C. difficile negative.  Subsequently, condition has improved with improved symptoms.  Tolerating diet.  GI has cleared the patient for discharge.  Discharge patient home today with outpatient follow-up with PCP and GI.   Discharge Diagnoses:   Abdominal pain with nausea and vomiting and diarrhea Dehydration - Imaging as above.  Stool for C. difficile negative. -Subsequently, condition has improved with improved symptoms.  Tolerating diet.  GI has cleared the patient for discharge.  Discharge patient home today with outpatient follow-up with PCP and GI.  Dr. [name] will arrange for outpatient follow-up with him for later this week  Ulcerative colitis - Continue mesalamine  and azathioprine .  Outpatient follow-up with GI  Leukocytosis - Questionable  cause.  Resolved  Hypokalemia - Resolved  Hypertension - Blood pressure currently on the lower side.  Hold amlodipine till reevaluation by PCP   Discharge Instructions   Allergies as of 02/25/2024       Reactions   Prednisone Other (See Comments)   Abdominal pain         Medication List     STOP taking these medications    amLODipine 5 MG tablet Commonly known as: NORVASC       TAKE these medications    azaTHIOprine  50 MG tablet Commonly known as: IMURAN  Take 150 mg by mouth daily.   Canasa  1000 MG suppository Generic drug: mesalamine  Place 1,000 mg rectally at bedtime.   LIALDA  PO Take 2 tablets by mouth daily.   MULTIVITAL PO Take 1 tablet by mouth daily.   ondansetron  4 MG tablet Commonly known as: ZOFRAN  Take 1 tablet (4 mg total) by mouth every 6 (six) hours as needed for nausea.   PROBIOTIC DAILY PO Take 1 capsule by mouth daily.   traMADol  50 MG tablet Commonly known as: Ultram  Take 1 tablet (50 mg total) by mouth every 6 (six) hours as needed.        Follow-up Information     Osei-Bonsu, [name], MD. Schedule an appointment as soon as possible for a visit in 1 week(s).   Specialty: Internal Medicine Contact information: [address] [place] [address] [phone]                Allergies  Allergen Reactions   Prednisone Other (See Comments)    Abdominal pain     Consultations: GI   Procedures/Studies: CT ABDOMEN PELVIS W CONTRAST Result Date: 02/24/2024 CLINICAL DATA:  Abdominal pain, acute, nonlocalized. History  of ulcerative colitis. EXAM: CT ABDOMEN AND PELVIS WITH CONTRAST TECHNIQUE: Multidetector CT imaging of the abdomen and pelvis was performed using the standard protocol following bolus administration of intravenous contrast. RADIATION DOSE REDUCTION: This exam was performed according to the departmental dose-optimization program which includes automated exposure control, adjustment of the mA  and/or kV according to patient size and/or use of iterative reconstruction technique. 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 RIGHT lower lobe atelectasis. Hepatobiliary: Liver is diffusely hypoattenuating. Status post cholecystectomy. Pancreas: Unremarkable. No pancreatic ductal dilatation or surrounding inflammatory changes. Spleen: Unremarkable. Adrenals/Urinary Tract: Adrenal glands are unremarkable. Kidneys enhance symmetrically. Bilateral parapelvic cysts and extrarenal pelves. LEFT-sided extrarenal pelvis is mildly increased in size compared to remote prior with no etiology for obstruction identified. Bladder is unremarkable. Stomach/Bowel: No evidence of bowel obstruction. There are multiple areas of circumferential bowel wall thickening or featureless areas throughout the colon. This is most pronounced in the sigmoid colon, splenic flexure, mid transverse colon and RIGHT hemicolon. Small hiatal hernia. Mild gastric fold prominence of the decompressed stomach. Vascular/Lymphatic: No significant vascular findings are present. Similar prominent LEFT periaortic retroperitoneal lymph nodes with representative LEFT periaortic lymph node measuring 7 mm in the short axis, unchanged (series 2, image 39). Reproductive: Heterogeneous enhancement of the uterus of multiple prominent parametrial vessels. Other: No free air or free fluid. Musculoskeletal: LEFT iliac bone island. Degenerative changes of the lower lumbar spine. IMPRESSION: 1. There are multiple areas of circumferential bowel wall thickening throughout the colon. This is most pronounced in the sigmoid colon, splenic flexure, mid transverse colon and RIGHT hemicolon. Findings likely reflect history of inflammatory colitis. 2. Favored underlying hepatic steatosis. 3. Heterogeneous enhancement of the uterus with multiple prominent parametrial vessels.  This is nonspecific but can be seen in the setting of pelvic congestion syndrome. Electronically Signed   By: Clancy Crimes M.D.   On: 02/24/2024 17:28      Subjective: Patient seen and examined at bedside.  Feels much better and is tolerating diet.  Denies any current worsening abdominal pain or nausea.  Diarrhea has improved.  Feels ok to go home today.  Discharge Exam: Vitals:   02/25/24 0553 02/25/24 0946  BP: 111/69 107/72  Pulse: 98 88  Resp: 16 18  Temp: 100 F (37.8 C) 98.4 F (36.9 C)  SpO2: 96% 96%    General: Pt is alert, awake, not in acute distress Cardiovascular: rate controlled, S1/S2 + Respiratory: bilateral decreased breath sounds at bases Abdominal: Soft, NT, ND, bowel sounds + Extremities: no edema, no cyanosis    The results of significant diagnostics from this hospitalization (including imaging, microbiology, ancillary and laboratory) are listed below for reference.     Microbiology: Recent Results (from the past 240 hours)  C Difficile Quick Screen w PCR reflex     Status: None   Collection Time: 02/24/24  8:52 PM   Specimen: Stool  Result Value Ref Range Status   C Diff antigen NEGATIVE NEGATIVE Final   C Diff toxin NEGATIVE NEGATIVE Final   C Diff interpretation No C. difficile detected.  Final    Comment: Performed at [facility], [address], [place], [place] [address]     Labs: BNP (last 3 results) No results for input(s): "BNP" in the last 8760 hours. Basic Metabolic Panel: Recent Labs  Lab 02/24/24 1623 02/25/24 0534  NA 137 136  K  3.1* 3.4*  CL 104 107  CO2 22 23  GLUCOSE 88 103*  BUN 11 8  CREATININE 0.66 0.54  CALCIUM 8.6* 8.3*  MG  --  2.0  PHOS  --  3.3   Liver Function Tests: Recent Labs  Lab 02/24/24 1623 02/25/24 0534  AST 19 13*  ALT 19 16  ALKPHOS 48 45  BILITOT 1.2 1.7*  PROT 6.9 6.6  ALBUMIN 3.6 3.1*   Recent Labs  Lab 02/24/24 1623  LIPASE 34   No results for input(s):  "AMMONIA" in the last 168 hours. CBC: Recent Labs  Lab 02/24/24 1623 02/25/24 0534  WBC 16.7* 16.7*  HGB 12.8 12.0  HCT 40.4 38.1  MCV 95.1 94.8  PLT 343 341   Cardiac Enzymes: No results for input(s): "CKTOTAL", "CKMB", "CKMBINDEX", "TROPONINI" in the last 168 hours. BNP: Invalid input(s): "POCBNP" CBG: No results for input(s): "GLUCAP" in the last 168 hours. D-Dimer No results for input(s): "DDIMER" in the last 72 hours. Hgb A1c No results for input(s): "HGBA1C" in the last 72 hours. Lipid Profile No results for input(s): "CHOL", "HDL", "LDLCALC", "TRIG", "CHOLHDL", "LDLDIRECT" in the last 72 hours. Thyroid function studies No results for input(s): "TSH", "T4TOTAL", "T3FREE", "THYROIDAB" in the last 72 hours.  Invalid input(s): "FREET3" Anemia work up No results for input(s): "VITAMINB12", "FOLATE", "FERRITIN", "TIBC", "IRON ", "RETICCTPCT" in the last 72 hours. Urinalysis    Component Value Date/Time   COLORURINE YELLOW 02/24/2024 1743   APPEARANCEUR CLEAR 02/24/2024 1743   LABSPEC 1.019 02/24/2024 1743   PHURINE 6.0 02/24/2024 1743   GLUCOSEU NEGATIVE 02/24/2024 1743   HGBUR MODERATE (A) 02/24/2024 1743   BILIRUBINUR NEGATIVE 02/24/2024 1743   KETONESUR NEGATIVE 02/24/2024 1743   PROTEINUR NEGATIVE 02/24/2024 1743   UROBILINOGEN 0.2 12/02/2013 1534   NITRITE NEGATIVE 02/24/2024 1743   LEUKOCYTESUR NEGATIVE 02/24/2024 1743   Sepsis Labs Recent Labs  Lab 02/24/24 1623 02/25/24 0534  WBC 16.7* 16.7*   Microbiology Recent Results (from the past 240 hours)  C Difficile Quick Screen w PCR reflex     Status: None   Collection Time: 02/24/24  8:52 PM   Specimen: Stool  Result Value Ref Range Status   C Diff antigen NEGATIVE NEGATIVE Final   C Diff toxin NEGATIVE NEGATIVE Final   C Diff interpretation No C. difficile detected.  Final    Comment: Performed at [facility], [address] [place], [place] [address]     Time coordinating  discharge: 35 minutes  SIGNED:   Audria Leather, MD  [facility] 02/26/2024, 11:23 AM

## 2024-02-25 NOTE — Progress Notes (Signed)
 Progress Note    ASSESSMENT AND PLAN:   Acute gastroenteritis- neg stool for C diff. CT diffuse colitis. Nl Sed rate. WBC count still elevated Underlying H/O ulcerative colitis Allergic to prednisone (pt adamant)  Plan: - Observe for another 24 hours - Follow stool studies for GI pathogens, calprotectin. - Follow CRP - Advance diet. - Continue mesalamine/azathioprine - Replace potassium.  Recheck CBC in AM - D/W pt and daughter extensively - Dr [name] taking over patient care tomorrow.     SUBJECTIVE   Doing much better. Denies having any nausea or vomiting. No further abdominal pain The diarrhea has more or less resolved. Getting potassium supplements    OBJECTIVE:     Vital signs in last 24 hours: Temp:  [98.4 F (36.9 C)-100 F (37.8 C)] 98.4 F (36.9 C) (05/18 0946) Pulse Rate:  [88-129] 88 (05/18 0946) Resp:  [16-22] 18 (05/18 0946) BP: (107-143)/(65-83) 107/72 (05/18 0946) SpO2:  [96 %-100 %] 96 % (05/18 0946) Last BM Date : 02/24/24 General:   Alert, well-developed, in NAD EENT:  Normal hearing, non icteric sclera, conjunctive pink.  Heart:  Regular rate and rhythm; no murmur.  No lower extremity edema   Pulm: Normal respiratory effort, lungs CTA bilaterally without wheezes or crackles. Abdomen:  Soft, nondistended, nontender.  Normal bowel sounds,.       Neurologic:  Alert and  oriented x4;  grossly normal neurologically. Psych:  Pleasant, cooperative.  Normal mood and affect.   Intake/Output from previous day: 05/17 0701 - 05/18 0700 In: 1159 [P.O.:160; IV Piggyback:999] Out: -  Intake/Output this shift: Total I/O In: 120 [P.O.:120] Out: -   Lab Results: Recent Labs    02/24/24 1623 02/25/24 0534  WBC 16.7* 16.7*  HGB 12.8 12.0  HCT 40.4 38.1  PLT 343 341   BMET Recent Labs    02/24/24 1623 02/25/24 0534  NA 137 136  K 3.1* 3.4*  CL 104 107  CO2 22 23  GLUCOSE 88 103*  BUN 11 8  CREATININE 0.66 0.54  CALCIUM 8.6*  8.3*   LFT Recent Labs    02/25/24 0534  PROT 6.6  ALBUMIN 3.1*  AST 13*  ALT 16  ALKPHOS 45  BILITOT 1.7*   PT/INR No results for input(s): "LABPROT", "INR" in the last 72 hours. Hepatitis Panel No results for input(s): "HEPBSAG", "HCVAB", "HEPAIGM", "HEPBIGM" in the last 72 hours.  CT ABDOMEN PELVIS W CONTRAST Result Date: 02/24/2024 CLINICAL DATA:  Abdominal pain, acute, nonlocalized. History of ulcerative colitis. EXAM: CT ABDOMEN AND PELVIS WITH CONTRAST TECHNIQUE: Multidetector CT imaging of the abdomen and pelvis was performed using the standard protocol following bolus administration of intravenous contrast. RADIATION DOSE REDUCTION: This exam was performed according to the departmental dose-optimization program which includes automated exposure control, adjustment of the mA and/or kV according to patient size and/or use of iterative reconstruction technique. 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 RIGHT lower lobe atelectasis. Hepatobiliary: Liver is diffusely hypoattenuating. Status post cholecystectomy. Pancreas: Unremarkable. No pancreatic ductal dilatation or surrounding inflammatory changes. Spleen: Unremarkable. Adrenals/Urinary Tract: Adrenal glands are unremarkable. Kidneys enhance symmetrically. Bilateral parapelvic cysts and extrarenal pelves. LEFT-sided extrarenal pelvis is mildly increased in size compared to remote prior with no etiology for obstruction identified. Bladder is unremarkable. Stomach/Bowel: No evidence of bowel obstruction. There are multiple areas of circumferential bowel wall thickening or featureless areas throughout  the colon. This is most pronounced in the sigmoid colon, splenic flexure, mid transverse colon and RIGHT hemicolon. Small hiatal hernia. Mild gastric fold prominence of the decompressed stomach. Vascular/Lymphatic: No significant vascular  findings are present. Similar prominent LEFT periaortic retroperitoneal lymph nodes with representative LEFT periaortic lymph node measuring 7 mm in the short axis, unchanged (series 2, image 39). Reproductive: Heterogeneous enhancement of the uterus of multiple prominent parametrial vessels. Other: No free air or free fluid. Musculoskeletal: LEFT iliac bone island. Degenerative changes of the lower lumbar spine. IMPRESSION: 1. There are multiple areas of circumferential bowel wall thickening throughout the colon. This is most pronounced in the sigmoid colon, splenic flexure, mid transverse colon and RIGHT hemicolon. Findings likely reflect history of inflammatory colitis. 2. Favored underlying hepatic steatosis. 3. Heterogeneous enhancement of the uterus with multiple prominent parametrial vessels. This is nonspecific but can be seen in the setting of pelvic congestion syndrome. Electronically Signed   By: Clancy Crimes M.D.   On: 02/24/2024 17:28     Principal Problem:   Ulcerative colitis (HCC) Active Problems:   Diarrhea   Nausea and vomiting   Generalized abdominal pain   Leukocytosis   Lower abdominal pain     LOS: 0 days     Magnus Schuller, MD 02/25/2024, 11:33 AM [facility] [phone]

## 2024-02-26 DIAGNOSIS — K5190 Ulcerative colitis, unspecified, without complications: Secondary | ICD-10-CM | POA: Diagnosis not present

## 2024-02-26 LAB — BASIC METABOLIC PANEL WITH GFR
Anion gap: 5 (ref 5–15)
BUN: 6 mg/dL (ref 6–20)
CO2: 25 mmol/L (ref 22–32)
Calcium: 8.5 mg/dL — ABNORMAL LOW (ref 8.9–10.3)
Chloride: 108 mmol/L (ref 98–111)
Creatinine, Ser: 0.47 mg/dL (ref 0.44–1.00)
GFR, Estimated: >60 mL/min (ref >60)
Glucose, Bld: 99 mg/dL (ref 70–99)
Potassium: 3.8 mmol/L (ref 3.5–5.1)
Sodium: 138 mmol/L (ref 135–145)

## 2024-02-26 LAB — CBC WITH DIFFERENTIAL/PLATELET
Abs Immature Granulocytes: 0.03 10*3/uL (ref 0.00–0.07)
Basophils Absolute: 0 10*3/uL (ref 0.0–0.1)
Basophils Relative: 0 %
Eosinophils Absolute: 0.2 10*3/uL (ref 0.0–0.5)
Eosinophils Relative: 3 %
HCT: 36.6 % (ref 36.0–46.0)
Hemoglobin: 11.6 g/dL — ABNORMAL LOW (ref 12.0–15.0)
Immature Granulocytes: 0 %
Lymphocytes Relative: 15 %
Lymphs Abs: 1.2 10*3/uL (ref 0.7–4.0)
MCH: 30.1 pg (ref 26.0–34.0)
MCHC: 31.7 g/dL (ref 30.0–36.0)
MCV: 94.8 fL (ref 80.0–100.0)
Monocytes Absolute: 0.7 10*3/uL (ref 0.1–1.0)
Monocytes Relative: 9 %
Neutro Abs: 5.6 10*3/uL (ref 1.7–7.7)
Neutrophils Relative %: 73 %
Platelets: 303 10*3/uL (ref 150–400)
RBC: 3.86 MIL/uL — ABNORMAL LOW (ref 3.87–5.11)
RDW: 14.7 % (ref 11.5–15.5)
WBC: 7.8 10*3/uL (ref 4.0–10.5)
nRBC: 0 % (ref 0.0–0.2)

## 2024-02-26 LAB — MAGNESIUM: Magnesium: 2 mg/dL (ref 1.7–2.4)

## 2024-02-26 NOTE — Plan of Care (Signed)
   Problem: Clinical Measurements: Goal: Will remain free from infection Outcome: Progressing Goal: Diagnostic test results will improve Outcome: Progressing

## 2024-02-26 NOTE — Plan of Care (Signed)

## 2024-02-26 NOTE — Progress Notes (Signed)
   02/26/24 1007  TOC Brief Assessment  Insurance and Status Reviewed  Patient has primary care physician Yes  Home environment has been reviewed resides in private residence  Prior level of function: Independent  Prior/Current Home Services No current home services  Social Drivers of Health Review SDOH reviewed no interventions necessary  Readmission risk has been reviewed Yes  Transition of care needs no transition of care needs at this time

## 2024-02-26 NOTE — Plan of Care (Signed)

## 2024-02-26 NOTE — Progress Notes (Signed)
Pt was discharged home today. Instructions were reviewed with patient, and questions were answered. Pt was taken to main entrance via wheelchair by NT.  

## 2024-05-03 ENCOUNTER — Other Ambulatory Visit: Payer: Self-pay | Admitting: Physician Assistant

## 2024-05-03 DIAGNOSIS — R1032 Left lower quadrant pain: Secondary | ICD-10-CM

## 2024-06-03 ENCOUNTER — Ambulatory Visit: Admitting: Obstetrics and Gynecology

## 2024-06-03 ENCOUNTER — Encounter: Payer: Self-pay | Admitting: Obstetrics and Gynecology

## 2024-06-03 ENCOUNTER — Other Ambulatory Visit (HOSPITAL_COMMUNITY)
Admission: RE | Admit: 2024-06-03 | Discharge: 2024-06-03 | Disposition: A | Discharge status: Home / Self Care | Source: Other Acute Inpatient Hospital | Attending: Obstetrics and Gynecology | Admitting: Obstetrics and Gynecology

## 2024-06-03 VITALS — BP 152/85 | HR 74 | Ht 65.0 in | Wt 171.8 lb

## 2024-06-03 DIAGNOSIS — N8110 Cystocele, unspecified: Secondary | ICD-10-CM | POA: Diagnosis not present

## 2024-06-03 DIAGNOSIS — R350 Frequency of micturition: Secondary | ICD-10-CM

## 2024-06-03 DIAGNOSIS — R319 Hematuria, unspecified: Secondary | ICD-10-CM | POA: Insufficient documentation

## 2024-06-03 DIAGNOSIS — R32 Unspecified urinary incontinence: Secondary | ICD-10-CM | POA: Diagnosis not present

## 2024-06-03 DIAGNOSIS — Z01419 Encounter for gynecological examination (general) (routine) without abnormal findings: Secondary | ICD-10-CM | POA: Insufficient documentation

## 2024-06-03 LAB — URINALYSIS, ROUTINE W REFLEX MICROSCOPIC
Bilirubin Urine: NEGATIVE
Glucose, UA: NEGATIVE mg/dL
Ketones, ur: NEGATIVE mg/dL
Leukocytes,Ua: NEGATIVE
Nitrite: NEGATIVE
Protein, ur: NEGATIVE mg/dL
Specific Gravity, Urine: 1.013 (ref 1.005–1.030)
pH: 5 (ref 5.0–8.0)

## 2024-06-03 LAB — POCT URINALYSIS DIP (CLINITEK)
Bilirubin, UA: NEGATIVE
Glucose, UA: NEGATIVE mg/dL
Ketones, POC UA: NEGATIVE mg/dL
Leukocytes, UA: NEGATIVE
Nitrite, UA: NEGATIVE
POC PROTEIN,UA: NEGATIVE
Spec Grav, UA: 1.02 (ref 1.010–1.025)
Urobilinogen, UA: 0.2 U/dL
pH, UA: 5.5 (ref 5.0–8.0)

## 2024-06-03 NOTE — Patient Instructions (Signed)
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  We discussed the fact that it is not life threatening but there are several treatment options. For treatment of pelvic organ prolapse, we discussed options for management including expectant management, conservative management, and surgical management, such as Kegels, a pessary, pelvic floor physical therapy, and specific surgical procedures.

## 2024-06-03 NOTE — Assessment & Plan Note (Signed)
-   Unclear type of incontinence. She wants to proceed with urodynamic testing.  - We discussed the option of urodynamic testing, especially if she would like to pursue surgery
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
## 2024-06-03 NOTE — Assessment & Plan Note (Signed)
-   Needs pap/ annual exam

## 2024-06-03 NOTE — Assessment & Plan Note (Signed)
 Stage II anterior, Stage I posterior, Stage 0 apical prolapse - For treatment of pelvic organ prolapse, we discussed options for management including expectant management, conservative management, and surgical management, such as Kegels, a pessary, pelvic floor physical therapy, and specific surgical procedures (anterior repair, possible SSLF, perineorrhaphy). - She prefers to start with pelvic PT, referral placed. Handouts provided on pessary and surgical options.

## 2024-06-04 LAB — URINE CULTURE

## 2024-08-08 ENCOUNTER — Other Ambulatory Visit: Payer: Self-pay

## 2024-08-08 ENCOUNTER — Encounter: Attending: Obstetrics and Gynecology | Admitting: Physical Therapy

## 2024-08-08 ENCOUNTER — Encounter: Payer: Self-pay | Admitting: Physical Therapy

## 2024-08-08 DIAGNOSIS — N8110 Cystocele, unspecified: Secondary | ICD-10-CM | POA: Diagnosis present

## 2024-08-08 DIAGNOSIS — M6281 Muscle weakness (generalized): Secondary | ICD-10-CM | POA: Insufficient documentation

## 2024-08-08 DIAGNOSIS — N393 Stress incontinence (female) (male): Secondary | ICD-10-CM | POA: Insufficient documentation

## 2024-08-08 NOTE — Therapy (Signed)
 OUTPATIENT PHYSICAL THERAPY FEMALE PELVIC EVALUATION   Patient Name: Kelli Whitaker MRN: [id number] DOB:[birth year], 58 y.o., female Today's Date: 08/08/2024  END OF SESSION:  PT End of Session - 08/08/24 1147     Visit Number 1    Date for Recertification  02/06/25    Authorization Type [employer]    PT Start Time 1130    PT Stop Time 1215    PT Time Calculation (min) 45 min    Activity Tolerance Patient tolerated treatment well    Behavior During Therapy [facility] for tasks assessed/performed          Past Medical History:  Diagnosis Date   Anemia 11/2013   Chest pain 11/2013   Family history of anesthesia complication    ' My Brother went into cardiac arrest    Refusal of blood transfusions as patient is Jehovah's Witness    Ulcerative colitis (HCC)    Past Surgical History:  Procedure Laterality Date   CHOLECYSTECTOMY     neck mass removed     TONSILLECTOMY     TUBAL LIGATION     Patient Active Problem List   Diagnosis Date Noted   Prolapse of anterior vaginal wall 06/03/2024   Incontinence in female 06/03/2024   Women's annual routine gynecological examination 06/03/2024   Diarrhea 02/24/2024   Nausea and vomiting 02/24/2024   Generalized abdominal pain 02/24/2024   Leukocytosis 02/24/2024   Lower abdominal pain 02/24/2024   Refusal of blood transfusions as patient is Jehovah's Witness 12/03/2013   Chest pain 12/01/2013   Ulcerative colitis (HCC) 12/01/2013   Anemia 12/01/2013    PCP: Catalina Bare  REFERRING PROVIDER: Marilynne Rosaline SAILOR, MD   REFERRING DIAG:  N81.10 (ICD-10-CM) - Prolapse of anterior vaginal wall  N39.3 (ICD-10-CM) - SUI (stress urinary incontinence, female)    THERAPY DIAG:  Muscle weakness (generalized) - Plan: PT plan of care cert/re-cert  Prolapse of anterior vaginal wall - Plan: PT plan of care cert/re-cert  Stress incontinence (female) (female) - Plan: PT plan of care cert/re-cert  Rationale for Evaluation and  Treatment: Rehabilitation  ONSET DATE: 2022  SUBJECTIVE:                                                                                                                                                                                           SUBJECTIVE STATEMENT: Patient reports the prolapse come on slowly. She noticed it when she would wipe. Started to get worse with lifting her grandson. Patient prolapse comes out and she has to push the prolapse back in to fully urinate. She will leak without control.  Fluid intake: water  PERTINENT HISTORY:  Medications for current condition: none Surgeries: Cholecystomy; Tubal Ligation Other:ulcerative colitis  Sexual abuse: No  PAIN:  Are you having pain? No  PRECAUTIONS: None  RED FLAGS: None   WEIGHT BEARING RESTRICTIONS: No  FALLS:  Has patient fallen in last 6 months? No  OCCUPATION: not working  ACTIVITY LEVEL : walks  PLOF: Independent  PATIENT GOALS: reduce bulge and  strengthen the pelvic floor   BOWEL MOVEMENT:No issues   URINATION: Pain with urination: No Fully empty bladder: Yes:                                  Post-void dribble: No Stream: Strong Urgency: No Frequency:during the day 6-7                                                         Nocturia: Yes:  2-3 times   Leakage: holding the urine too long Pads/briefs: Yes: 1 pad per day  INTERCOURSE:not active   PREGNANCY: Vaginal deliveries 9 Tearing Yes:   Episiotomy No C-section deliveries 0 Currently pregnant No  PROLAPSE: Stage II anterior, Stage I posterior, Stage 0 apical prolapse    OBJECTIVE:  Note: Objective measures were completed at Evaluation unless otherwise noted.  DIAGNOSTIC FINDINGS:  Post Void Residual 5 mL   Pelvic floor strength I/V, puborectalis III/V external anal sphincter IV/V   PATIENT SURVEYS:  PFIQ-7: 28 UIQ-7: 24  COGNITION: Overall cognitive status: Within functional limits for tasks  assessed     SENSATION: Light touch: Appears intact   FUNCTIONAL TESTS:  Single leg stance:  Rt:2 sec  Lt:2 sec Sit-up test: Squat: Bed mobility:  GAIT: Assistive device utilized: None  POSTURE: rounded shoulders, forward head, and decreased lumbar lordosis   LUMBARAROM/PROM:lumbar ROM decreased by 25%   LOWER EXTREMITY ROM: full bilateral hip ROM   LOWER EXTREMITY MMT:  MMT Right eval Left eval  Hip extension 3+/5 3+/5  Hip abduction 3/5 3/5   (Blank rows = not tested) PALPATION:  Abdominal:   Diastasis: No Distortion: No  Breathing: difficulty with opening the abdomen with diaphragmatic breathing Scar tissue: No                External Perineal Exam: good coloring; bulge coming out of the introitus                             Internal Pelvic Floor: no tenderness  Patient confirms identification and approves PT to assess internal pelvic floor and treatment Yes No emotional/communication barriers or cognitive limitation. Patient is motivated to learn. Patient understands and agrees with treatment goals and plan. PT explains patient will be examined in standing, sitting, and lying down to see how their muscles and joints work. When they are ready, they will be asked to remove their underwear so PT can examine their perineum. The patient is also given the option of providing their own chaperone as one is not provided in our facility. The patient also has the right and is explained the right to defer or refuse any part of the evaluation or treatment including the internal exam. With the patient's consent, PT will use one gloved finger to gently assess the  muscles of the pelvic floor, seeing how well it contracts and relaxes and if there is muscle symmetry. After, the patient will get dressed and PT and patient will discuss exam findings and plan of care. PT and patient discuss plan of care, schedule, attendance policy and HEP activities.   PELVIC MMT:   MMT eval   Vaginal 3/5 anterior and posterior; right side 2/5; left side 1/5; no hug of therapist finger  (Blank rows = not tested)        TONE: Average tone of the pelvic floor  PROLAPSE: Anterior wall comes past the introitus and is reducible  TODAY'S TREATMENT:                                                                                                                              DATE: 08/08/24  EVAL Examination completed, findings reviewed, pt educated on POC, HEP, and female pelvic floor anatomy, reasoning with pelvic floor assessment internally with pt consent, and abdominal massage. Pt motivated to participate in PT and agreeable to attempt recommendations.     PATIENT EDUCATION:  Education details: educated patient on how to lay down in the end of the day to reset her prolapse for 10 minutes.  Person educated: Patient Education method: Explanation, Demonstration, Tactile cues, Verbal cues, and Handouts Education comprehension: verbalized understanding, returned demonstration, verbal cues required, tactile cues required, and needs further education  HOME EXERCISE PROGRAM: See above  ASSESSMENT:  CLINICAL IMPRESSION: Patient is a 58 y.o. female who was seen today for physical therapy evaluation and treatment for prolapse and stress incontinence. Patient reports she has had issues with a bulge for the past 3 years. She has to place it back in the vaginal canal to fully empty her bladder.  She does get uncomfortable when holding her urine too long. Pelvic floor strength is 3/5 anterior and posterior, 2/5 on the right and 1/5 on the left. No hug of therapist finger. Her bladder was past the introitus while she was laying on her back. Her prolapse is reducible. She has difficulty with opening the abdomen with diaphragmatic breathing. Patient will benefit from skilled therapy to work on management of her prolapse and reducing her urinary leakage.   OBJECTIVE IMPAIRMENTS: decreased activity  tolerance, decreased coordination, decreased strength, and increased fascial restrictions.   ACTIVITY LIMITATIONS: continence and toileting  PARTICIPATION LIMITATIONS: community activity  PERSONAL FACTORS: Time since onset of injury/illness/exacerbation are also affecting patient's functional outcome.   REHAB POTENTIAL: Good  CLINICAL DECISION MAKING: Stable/uncomplicated  EVALUATION COMPLEXITY: Low   GOALS: Goals reviewed with patient? Yes  SHORT TERM GOALS: Target date: 09/05/24  Patient able to perform diaphragmatic breathing to lengthen her pelvic floor.  Baseline:not educated yet Goal status: INITIAL  2.  Patient educated on positions to lay in to assist with reduction of prolapse by the end of the day.  Baseline: not educated yet.  Goal status: INITIAL   LONG TERM GOALS: Target  date: 02/07/24  Patient independent with advanced HEP for core and pelvic floor strength.  Baseline: not educated yet Goal status: INITIAL  2.  Patient educated on postures that reduce the stress on her prolapse to reduce progress.  Baseline: not educated yet Goal status: INITIAL  3.  Patient educated on lifting with decreased pressure on the pelvic floor to reduce progression of prolapse.  Baseline: not educated yet Goal status: INITIAL  4.  Patient educated on prolapse management for daily tasks. Baseline: not educated yet Goal status: INITIAL  5.  Patient is able to perform maneuvers to fully empty her bladder when on the commode.  Baseline:  Goal status: INITIAL   PLAN:  PT FREQUENCY: 1x/week  PT DURATION: 6 months  PLANNED INTERVENTIONS: 97110-Therapeutic exercises, 97530- Therapeutic activity, 97112- Neuromuscular re-education, 97535- Self Care, [id number]- Manual therapy, [id number]- Electrical stimulation (unattended), Patient/Family education, Joint mobilization, Spinal mobilization, Cryotherapy, Moist heat, and Biofeedback  PLAN FOR NEXT SESSION: diaphragmatic breathing,  postural awareness to reduce pressure on the prolapse, go over how to lay down to reduce prolapse, go over other products to assist with prolapse   Channing Pereyra, PT 08/08/24 4:12 PM

## 2024-08-08 NOTE — Patient Instructions (Signed)
The first picture shows that there is no effect on the pelvic floor with gravity eliminated. The next three show that with a wedge pillow or a few pillows from home under your pelvis the pelvic floor is inverted and may relax and allows gravity to help return prolapsed areas more inward to help relieve symptoms. Do this 15-20 mins every evening when symptoms tend to be worse. Stop if you have pain or negative symptoms.    About Pelvic Support Problems Pelvic Support Problems Explained Ligaments, muscles, and connective tissue normally hold your bladder, uterus, and other organs in their proper places in your pelvis. When these tissues become weak, a problem with pelvic support may result. Weak support can cause one or more of the pelvic organs to drop down into the vagina. An organ may even drop so far that is partially exposed outside the body.  Pelvic support problems are named by the change in the organ. The main types of pelvic support problems are:  Cystocele: When the bladder drops down into your vagina.  Enterocele: When your small intestine drops between your vagina and rectum.  Rectocele: When your rectum bulges into the vaginal wall.  Uterine prolapse: When your uterus drops into your vagina.  Vaginal prolapse: When the top part of the vagina begins to droop. This sometimes happens after a hysterectomy (removal of the uterus).  Causes Pelvic support problems can be caused by many conditions. They may begin after you give birth, especially if you had a large baby. During childbirth, the muscles and skin of the birth canal (vagina) are stretched and sometimes torn. They heal over time but are not always exactly the same. A long pushing stage of labor may also weaken these tissues as well as very rapid births as the tissues do not have time to stretch so they tear.  Also, after menopause, there are changes in the vaginal walls resulting from a decrease in estrogen. Estrogen helps to keep the  tissues toned. Low levels of estrogen weaken the vaginal walls and may cause the bladder to shift from its normal position. As women get older, the loss of muscle tone and the relaxation of muscles may cause the uterus or other organs to drop.  Over time, conditions like chronic coughing, chronic constipation, doing a lot of heavy lifting, straining to pass stool, and obesity, can also weaken the pelvic support muscles.  Diagnosing Pelvic Support Problems Your health care provider will ask about your symptoms and do a pelvic examination. Your provider may also do a rectal exam during your pelvic exam. Your provider may ask you to: 1. Bear down and push (like you are having a bowel movement) so he or she can see if your bladder or other part of your body protrudes into the vagina. 2. Contract the muscles of your pelvis to check the strength of your pelvic muscles.  3. Do several types of urine, nerve and muscle tests of the pelvis and around the bladder to see what type of treatment is best for you.   Symptoms Symptoms of pelvic support problems depend on the organ involved, but may include:  urine leakage  stain or fecal loss after a bowel movement trouble having bowel movements  ache in the lower abdomen, groin, or lower back  bladder infection  a feeling of heaviness, pulling, or fullness in the pelvis, or a feeling that something is falling out of the vagina  an organ protruding from your vaginal opening  feeling  the need to support the organs or perineal area to empty bladder or bowels painful sexual intercourse.  Many women feel pelvic pressure or trouble holding their urine immediately after childbirth. For some, these symptoms go away permanently, in others they return as they get older.  Treatment Options A prolapsed organ cannot repair itself. Contact your health care provider as soon as you notice symptoms of a problem. Treatment depends on what the specific problem is and how far  advanced it is.  The symptoms caused by some pelvic support problems may simply be treated with changes in diet, medicine to soften the stool, weight loss, or avoiding strenuous activities. You may also do pelvic floor exercises to help strengthen your pelvic muscles.  Some cases of prolapse may require a special support device made from plastic or rubber called a pessary that fits into the vagina to support the uterus, vagina, or bladder. A pessary can also help women who leak urine when coughing, straining, or exercising. In mild cases, a tampon or vaginal diaphragm may be used instead of a pessary.  Talk to your doctor or health care provider about these options. In serious cases, surgery may be needed to put the organs back into their proper place. The uterus may be removed because of the pressure it puts on the bladder.  Your doctor will know what surgery will be best for you. How can I prevent pelvic support problems?  You can help prevent pelvic support problems by:  maintaining a healthy lifestyle  continuing to do pelvic floor exercises after you deliver a baby  maintaining a healthy weight  avoiding a lot of heavy lifting and lifting with your legs (not from your waist)  treating constipation and avoid getting    Eulis Foster, PT [facility] [address] [place], [place] [address] W: [phone] [email or web address]

## 2024-08-22 ENCOUNTER — Encounter: Attending: Obstetrics and Gynecology | Admitting: Physical Therapy

## 2024-08-22 ENCOUNTER — Encounter: Payer: Self-pay | Admitting: Physical Therapy

## 2024-08-22 DIAGNOSIS — N8110 Cystocele, unspecified: Secondary | ICD-10-CM | POA: Diagnosis present

## 2024-08-22 DIAGNOSIS — M6281 Muscle weakness (generalized): Secondary | ICD-10-CM | POA: Diagnosis present

## 2024-08-22 DIAGNOSIS — N393 Stress incontinence (female) (male): Secondary | ICD-10-CM | POA: Insufficient documentation

## 2024-08-22 NOTE — Therapy (Signed)
 OUTPATIENT PHYSICAL THERAPY FEMALE PELVIC TREATMENT   Patient Name: Kelli Whitaker MRN: [id number] DOB:1966/02/22, 58 y.o., female Today's Date: 08/22/2024  END OF SESSION:  PT End of Session - 08/22/24 1409     Visit Number 2    Date for Recertification  02/06/25    Authorization Type [employer]    Authorization Time Period 10/30- 11/28    Authorization - Visit Number 1    Authorization - Number of Visits 4    PT Start Time 1400    PT Stop Time 1450    PT Time Calculation (min) 50 min    Activity Tolerance Patient tolerated treatment well    Behavior During Therapy [facility] for tasks assessed/performed          Past Medical History:  Diagnosis Date   Anemia 11/2013   Chest pain 11/2013   Family history of anesthesia complication    ' My Brother went into cardiac arrest    Refusal of blood transfusions as patient is Jehovah's Witness    Ulcerative colitis (HCC)    Past Surgical History:  Procedure Laterality Date   CHOLECYSTECTOMY     neck mass removed     TONSILLECTOMY     TUBAL LIGATION     Patient Active Problem List   Diagnosis Date Noted   Prolapse of anterior vaginal wall 06/03/2024   Incontinence in female 06/03/2024   Women's annual routine gynecological examination 06/03/2024   Diarrhea 02/24/2024   Nausea and vomiting 02/24/2024   Generalized abdominal pain 02/24/2024   Leukocytosis 02/24/2024   Lower abdominal pain 02/24/2024   Refusal of blood transfusions as patient is Jehovah's Witness 12/03/2013   Chest pain 12/01/2013   Ulcerative colitis (HCC) 12/01/2013   Anemia 12/01/2013    PCP: Catalina Bare  REFERRING PROVIDER: Marilynne Rosaline SAILOR, MD   REFERRING DIAG:  N81.10 (ICD-10-CM) - Prolapse of anterior vaginal wall  N39.3 (ICD-10-CM) - SUI (stress urinary incontinence, female)    THERAPY DIAG:  Muscle weakness (generalized)  Prolapse of anterior vaginal wall  Stress incontinence (female) (female)  Rationale for Evaluation and  Treatment: Rehabilitation  ONSET DATE: 2022  SUBJECTIVE:                                                                                                                                                                                           SUBJECTIVE STATEMENT: NO changes from the evaluation.   Fluid intake: water   PERTINENT HISTORY:  Medications for current condition: none Surgeries: Cholecystomy; Tubal Ligation Other:ulcerative colitis  Sexual abuse: No  PAIN:  Are you having pain? No  PRECAUTIONS: None  RED  FLAGS: None   WEIGHT BEARING RESTRICTIONS: No  FALLS:  Has patient fallen in last 6 months? No  OCCUPATION: not working  ACTIVITY LEVEL : walks  PLOF: Independent  PATIENT GOALS: reduce bulge and  strengthen the pelvic floor   BOWEL MOVEMENT:No issues   URINATION: Pain with urination: No Fully empty bladder: Yes:                                  Post-void dribble: No Stream: Strong Urgency: No Frequency:during the day 6-7                                                         Nocturia: Yes:  2-3 times   Leakage: holding the urine too long Pads/briefs: Yes: 1 pad per day  INTERCOURSE:not active   PREGNANCY: Vaginal deliveries 9 Tearing Yes:   Episiotomy No C-section deliveries 0 Currently pregnant No  PROLAPSE: Stage II anterior, Stage I posterior, Stage 0 apical prolapse    OBJECTIVE:  Note: Objective measures were completed at Evaluation unless otherwise noted.  DIAGNOSTIC FINDINGS:  Post Void Residual 5 mL   Pelvic floor strength I/V, puborectalis III/V external anal sphincter IV/V   PATIENT SURVEYS:  PFIQ-7: 28 UIQ-7: 24  COGNITION: Overall cognitive status: Within functional limits for tasks assessed     SENSATION: Light touch: Appears intact   FUNCTIONAL TESTS:  Single leg stance:  Rt:2 sec  Lt:2 sec Sit-up test: Squat: Bed mobility:  GAIT: Assistive device utilized: None  POSTURE: rounded shoulders, forward  head, and decreased lumbar lordosis   LUMBARAROM/PROM:lumbar ROM decreased by 25%   LOWER EXTREMITY ROM: full bilateral hip ROM   LOWER EXTREMITY MMT:  MMT Right eval Left eval  Hip extension 3+/5 3+/5  Hip abduction 3/5 3/5   (Blank rows = not tested) PALPATION:  Abdominal:   Diastasis: No Distortion: No  Breathing: difficulty with opening the abdomen with diaphragmatic breathing Scar tissue: No                External Perineal Exam: good coloring; bulge coming out of the introitus                             Internal Pelvic Floor: no tenderness  Patient confirms identification and approves PT to assess internal pelvic floor and treatment Yes No emotional/communication barriers or cognitive limitation. Patient is motivated to learn. Patient understands and agrees with treatment goals and plan. PT explains patient will be examined in standing, sitting, and lying down to see how their muscles and joints work. When they are ready, they will be asked to remove their underwear so PT can examine their perineum. The patient is also given the option of providing their own chaperone as one is not provided in our facility. The patient also has the right and is explained the right to defer or refuse any part of the evaluation or treatment including the internal exam. With the patient's consent, PT will use one gloved finger to gently assess the muscles of the pelvic floor, seeing how well it contracts and relaxes and if there is muscle symmetry. After, the patient will get dressed and PT and patient will discuss exam  findings and plan of care. PT and patient discuss plan of care, schedule, attendance policy and HEP activities.   PELVIC MMT:   MMT eval  Vaginal 3/5 anterior and posterior; right side 2/5; left side 1/5; no hug of therapist finger  (Blank rows = not tested)        TONE: Average tone of the pelvic floor  PROLAPSE: Anterior wall comes past the introitus and is  reducible  TODAY'S TREATMENT:     08/22/24 Manual: Soft tissue mobilization: Manual work to the diaphragm for diaphragmatic breathing Myofascial release: Tissue rolling of the abdomen and lower rib cage Neuromuscular re-education: Core facilitation: Transverse abdominus contraction with tactile cues to engage the core 15 X Standing anti rotational press with green band 20 x each side Down training: Diplasmatic breathing with therapist assisting the rib cage to go downward on exhale Therapeutic activities: Functional strengthening activities: Educated patient on how to keep the distance between the rib cage and pubic bone to reduce the pressure on the prolapse with sit to stand and squatting. She was able to return demonstration Self-care: Reviewed with patient laying down to reset her prolapse and reduce progression of the prolapse                                                                                                                             DATE: 08/08/24  EVAL Examination completed, findings reviewed, pt educated on POC, HEP, and female pelvic floor anatomy, reasoning with pelvic floor assessment internally with pt consent, and abdominal massage. Pt motivated to participate in PT and agreeable to attempt recommendations.     PATIENT EDUCATION:  08/22/24 Education details:  Access Code: [email or web address] Person educated: Patient Education method: Explanation, Demonstration, Tactile cues, Verbal cues, and Handouts Education comprehension: verbalized understanding, returned demonstration, verbal cues required, tactile cues required, and needs further education  HOME EXERCISE PROGRAM: 08/22/24 Access Code: [email or web address] URL: [email or web address] Date: 08/22/2024 Prepared by: Channing Pereyra  Exercises - Supine Diaphragmatic Breathing  - 1 x daily - 7 x weekly - 3 sets - 10 reps - Seated Diaphragmatic Breathing  - 1 x daily - 7 x weekly - 3 sets - 10 reps - Hooklying  Transversus Abdominis Palpation  - 1 x daily - 7 x weekly - 1 sets - 10 reps - Standing Anti-Rotation Press with Anchored Resistance  - 1 x daily - 7 x weekly - 1 sets - 15 reps   ASSESSMENT:  CLINICAL IMPRESSION: Patient is a 58 y.o. female who was seen today for physical therapy  treatment for prolapse and stress incontinence. Patient understands how to keep the distance between the rib cage and pubic bone to reduce strain on the prolapse. She is able to engage her abdomen correctly with bringing the ribs together and not bulge the lower abdomen.  Patient will benefit from skilled therapy to work on management of her prolapse and reducing her urinary leakage.   OBJECTIVE IMPAIRMENTS: decreased activity  tolerance, decreased coordination, decreased strength, and increased fascial restrictions.   ACTIVITY LIMITATIONS: continence and toileting  PARTICIPATION LIMITATIONS: community activity  PERSONAL FACTORS: Time since onset of injury/illness/exacerbation are also affecting patient's functional outcome.   REHAB POTENTIAL: Good  CLINICAL DECISION MAKING: Stable/uncomplicated  EVALUATION COMPLEXITY: Low   GOALS: Goals reviewed with patient? Yes  SHORT TERM GOALS: Target date: 09/05/24  Patient able to perform diaphragmatic breathing to lengthen her pelvic floor.  Baseline:not educated yet Goal status: Met 08/22/24  2.  Patient educated on positions to lay in to assist with reduction of prolapse by the end of the day.  Baseline: not educated yet.  Goal status: Met 08/22/24   LONG TERM GOALS: Target date: 02/07/24  Patient independent with advanced HEP for core and pelvic floor strength.  Baseline: not educated yet Goal status: INITIAL  2.  Patient educated on postures that reduce the stress on her prolapse to reduce progress.  Baseline: not educated yet Goal status: INITIAL  3.  Patient educated on lifting with decreased pressure on the pelvic floor to reduce progression of  prolapse.  Baseline: not educated yet Goal status: INITIAL  4.  Patient educated on prolapse management for daily tasks. Baseline: not educated yet Goal status: INITIAL  5.  Patient is able to perform maneuvers to fully empty her bladder when on the commode.  Baseline:  Goal status: INITIAL   PLAN:  PT FREQUENCY: 1x/week  PT DURATION: 6 months  PLANNED INTERVENTIONS: 97110-Therapeutic exercises, 97530- Therapeutic activity, 97112- Neuromuscular re-education, 97535- Self Care, [id number]- Manual therapy, [id number]- Electrical stimulation (unattended), Patient/Family education, Joint mobilization, Spinal mobilization, Cryotherapy, Moist heat, and Biofeedback  PLAN FOR NEXT SESSION: postural awareness to reduce pressure on the prolapse,  go over other products to assist with prolapse, how to relax the pelvic flor to empty the bladder and double void   Channing Pereyra, PT 08/22/24 2:51 PM

## 2024-08-29 ENCOUNTER — Encounter: Payer: Self-pay | Admitting: Physical Therapy

## 2024-08-29 ENCOUNTER — Encounter: Admitting: Physical Therapy

## 2024-08-29 DIAGNOSIS — N393 Stress incontinence (female) (male): Secondary | ICD-10-CM

## 2024-08-29 DIAGNOSIS — M6281 Muscle weakness (generalized): Secondary | ICD-10-CM

## 2024-08-29 DIAGNOSIS — N8110 Cystocele, unspecified: Secondary | ICD-10-CM

## 2024-08-29 NOTE — Patient Instructions (Signed)
 Double Voiding can be a very useful technique to help overcome incomplete emptying of your bladder.  Incomplete emptying of urine can result in leakage after using the bathroom and increase the risk of urinary tract infection.   Initial Void: When you first sit down to urinate, ensure optimal positioning for bladder emptying by following these guidelines for toileting posture: Sit on the toilet seat - don't hover over the seat Support your trunk by placing your hands on your knees or thighs Spread your knees and hips wide Position your feet flat on the floor or elevate feet on phone books, foot stool (Squatty Potty), or wrapped toilet paper rolls (if having knees above hips helps you empty) Lean forward from your hips Maintain the normal inward curve in your lower back   Repeated Void: After your initial void is complete, follow these movement patterns and attempt going to the bathroom again. Stand up Rotate your hips as if doing hula hoop in one direction Rotate using the same action in the other direction Rock your hips and pelvis back and forwards (pelvic tilts) Rock your hips and pelvis side to side (tail wag) Sit back down and repeat your voiding technique This technique can be repeated as many times as you choose to help you empty your bladder more effectively.  How To Poop Better:  What are Good Poops? There is no one exact normal, but they should be REGULAR.  This varies from person to person and ranges from up to 3x/day or as little as 3-4/week.  This should stay consistent for you.  They should be formed and ideally one solid mass that doesn't fall apart or dissolve in the water and is brown in color.  Lifestyle Tips:  Fiber: Eat 25-31 grams per day Do not hold it.  If you need to go, GO! Try to go every day around the same time Walk and move more Probiotics for more healthy gut bacteria Water and fluids: half of your healthy body weight in ounces  Toileting  Tips:  Posture: knees above hips, back flat, look straight ahead, RELAX Relax all the muscles from your face down to your toes Breathe: slow deep breaths into your belly and pelvic floor is RELAXED Blow: Tighten belly and blow like blowing up a balloon, make "SH" sound, make a vowel sound with a deep voice Do NOT sit more than 10 minutes After you are finished, tighten the muscles to reset pelvic floor back to normal      Channing Pereyra, PT [facility] [address], [address] [place], [place] [address] W: [phone] [email or web address]

## 2024-08-29 NOTE — Therapy (Addendum)
 " OUTPATIENT PHYSICAL THERAPY FEMALE PELVIC TREATMENT   Patient Name: Kelli Whitaker MRN: [id number] DOB:14-Sep-1966, 58 y.o., female Today's Date: 08/29/2024  END OF SESSION:  PT End of Session - 08/29/24 1408     Visit Number 3    Date for Recertification  02/06/25    Authorization Type [employer]    Authorization Time Period 10/30- 11/28    Authorization - Visit Number 2    Authorization - Number of Visits 4    PT Start Time 1405    PT Stop Time 1450    PT Time Calculation (min) 45 min    Activity Tolerance Patient tolerated treatment well    Behavior During Therapy [facility] for tasks assessed/performed          Past Medical History:  Diagnosis Date   Anemia 11/2013   Chest pain 11/2013   Family history of anesthesia complication    ' My Brother went into cardiac arrest    Refusal of blood transfusions as patient is Jehovah's Witness    Ulcerative colitis (HCC)    Past Surgical History:  Procedure Laterality Date   CHOLECYSTECTOMY     neck mass removed     TONSILLECTOMY     TUBAL LIGATION     Patient Active Problem List   Diagnosis Date Noted   Prolapse of anterior vaginal wall 06/03/2024   Incontinence in female 06/03/2024   Women's annual routine gynecological examination 06/03/2024   Diarrhea 02/24/2024   Nausea and vomiting 02/24/2024   Generalized abdominal pain 02/24/2024   Leukocytosis 02/24/2024   Lower abdominal pain 02/24/2024   Refusal of blood transfusions as patient is Jehovah's Witness 12/03/2013   Chest pain 12/01/2013   Ulcerative colitis (HCC) 12/01/2013   Anemia 12/01/2013    PCP: Catalina Bare  REFERRING PROVIDER: Marilynne Rosaline SAILOR, MD   REFERRING DIAG:  N81.10 (ICD-10-CM) - Prolapse of anterior vaginal wall  N39.3 (ICD-10-CM) - SUI (stress urinary incontinence, female)    THERAPY DIAG:  Muscle weakness (generalized)  Prolapse of anterior vaginal wall  Stress incontinence (female) (female)  Rationale for Evaluation and  Treatment: Rehabilitation  ONSET DATE: 2022  SUBJECTIVE:                                                                                                                                                                                           SUBJECTIVE STATEMENT: Patient reports her pressure in the vaginal area is 70% better.  Fluid intake: water   PERTINENT HISTORY:  Medications for current condition: none Surgeries: Cholecystomy; Tubal Ligation Other:ulcerative colitis  Sexual abuse: No  PAIN:  Are you having pain?  No  PRECAUTIONS: None  RED FLAGS: None   WEIGHT BEARING RESTRICTIONS: No  FALLS:  Has patient fallen in last 6 months? No  OCCUPATION: not working  ACTIVITY LEVEL : walks  PLOF: Independent  PATIENT GOALS: reduce bulge and  strengthen the pelvic floor   BOWEL MOVEMENT:No issues   URINATION: Pain with urination: No Fully empty bladder: Yes:                                  Post-void dribble: No Stream: Strong Urgency: No Frequency:during the day 6-7                                                          Nocturia: Yes:  2-3 times   08/29/24: wakes up 1 time  Leakage: holding the urine too long Pads/briefs: Yes: 1 pad per day  INTERCOURSE:not active   PREGNANCY: Vaginal deliveries 9 Tearing Yes:   Episiotomy No C-section deliveries 0 Currently pregnant No  PROLAPSE: Stage II anterior, Stage I posterior, Stage 0 apical prolapse    OBJECTIVE:  Note: Objective measures were completed at Evaluation unless otherwise noted.  DIAGNOSTIC FINDINGS:  Post Void Residual 5 mL   Pelvic floor strength I/V, puborectalis III/V external anal sphincter IV/V   PATIENT SURVEYS:  PFIQ-7: 28 UIQ-7: 24  COGNITION: Overall cognitive status: Within functional limits for tasks assessed     SENSATION: Light touch: Appears intact   FUNCTIONAL TESTS:  Single leg stance:  Rt:2 sec  Lt:2 sec  GAIT: Assistive device utilized: None  POSTURE:  rounded shoulders, forward head, and decreased lumbar lordosis   LUMBARAROM/PROM:lumbar ROM decreased by 25%   LOWER EXTREMITY ROM: full bilateral hip ROM   LOWER EXTREMITY MMT:  MMT Right eval Left eval  Hip extension 3+/5 3+/5  Hip abduction 3/5 3/5   (Blank rows = not tested) PALPATION:  Abdominal:   Diastasis: No Distortion: No  Breathing: difficulty with opening the abdomen with diaphragmatic breathing Scar tissue: No                External Perineal Exam: good coloring; bulge coming out of the introitus                             Internal Pelvic Floor: no tenderness  Patient confirms identification and approves PT to assess internal pelvic floor and treatment Yes No emotional/communication barriers or cognitive limitation. Patient is motivated to learn. Patient understands and agrees with treatment goals and plan. PT explains patient will be examined in standing, sitting, and lying down to see how their muscles and joints work. When they are ready, they will be asked to remove their underwear so PT can examine their perineum. The patient is also given the option of providing their own chaperone as one is not provided in our facility. The patient also has the right and is explained the right to defer or refuse any part of the evaluation or treatment including the internal exam. With the patient's consent, PT will use one gloved finger to gently assess the muscles of the pelvic floor, seeing how well it contracts and relaxes and if there is muscle symmetry. After, the patient will get  dressed and PT and patient will discuss exam findings and plan of care. PT and patient discuss plan of care, schedule, attendance policy and HEP activities.   PELVIC MMT:   MMT eval  Vaginal 3/5 anterior and posterior; right side 2/5; left side 1/5; no hug of therapist finger  (Blank rows = not tested)        TONE: Average tone of the pelvic floor  PROLAPSE: Anterior wall comes past the  introitus and is reducible  TODAY'S TREATMENT:     08/29/24 Neuromuscular re-education: Core retraining: Transverse abdominus contraction with tactile cues to engage the core 15 X Marching with red band around knees with core engaged 15 x each side Bridges 15 x  Bridge with knees in and out with red band 2 x 10 Quadruped lift opposite arm and leg 2 x 10  Standing anti rotational press with green band 20 x each side Exercises: Stretches/mobility: Karolynn pose holding 30 sec 2 x  Therapeutic activities: Functional strengthening activities: Educated patient on double void method, ways to have a bowel movement, how to breath correctly to relax the pelvic floor  to fully empty her bladder.  Self-care: Educated patient on external prolapse support for when she is walking or standing too long.     08/22/24 Manual: Soft tissue mobilization: Manual work to the diaphragm for diaphragmatic breathing Myofascial release: Tissue rolling of the abdomen and lower rib cage Neuromuscular re-education: Core facilitation: Transverse abdominus contraction with tactile cues to engage the core 15 X Standing anti rotational press with green band 20 x each side Down training: Diplasmatic breathing with therapist assisting the rib cage to go downward on exhale Therapeutic activities: Functional strengthening activities: Educated patient on how to keep the distance between the rib cage and pubic bone to reduce the pressure on the prolapse with sit to stand and squatting. She was able to return demonstration Self-care: Reviewed with patient laying down to reset her prolapse and reduce progression of the prolapse                                                                                                                             DATE: 08/08/24  EVAL Examination completed, findings reviewed, pt educated on POC, HEP, and female pelvic floor anatomy, reasoning with pelvic floor assessment internally  with pt consent, and abdominal massage. Pt motivated to participate in PT and agreeable to attempt recommendations.     PATIENT EDUCATION:  08/22/24 Education details:  Access Code: [email or web address] Person educated: Patient Education method: Explanation, Demonstration, Tactile cues, Verbal cues, and Handouts Education comprehension: verbalized understanding, returned demonstration, verbal cues required, tactile cues required, and needs further education  HOME EXERCISE PROGRAM: 08/22/24 Access Code: [email or web address] URL: [email or web address] Date: 08/22/2024 Prepared by: Channing Pereyra  Exercises - Supine Diaphragmatic Breathing  - 1 x daily - 7 x weekly - 3 sets - 10 reps - Seated Diaphragmatic Breathing  - 1 x daily - 7 x weekly -  3 sets - 10 reps - Hooklying Transversus Abdominis Palpation  - 1 x daily - 7 x weekly - 1 sets - 10 reps - Standing Anti-Rotation Press with Anchored Resistance  - 1 x daily - 7 x weekly - 1 sets - 15 reps   ASSESSMENT:  CLINICAL IMPRESSION: Patient is a 58 y.o. female who was seen today for physical therapy  treatment for prolapse and stress incontinence.  Patient wakes up 1 time at night compared to 3 times. Patient reports her pressure in the vaginal area is 70% better.  Patient is able to fully empty her bladder. Today patient is learning how to strengthen her core and pelvic floor.  Patient will benefit from skilled therapy to work on management of her prolapse and reducing her urinary leakage.   OBJECTIVE IMPAIRMENTS: decreased activity tolerance, decreased coordination, decreased strength, and increased fascial restrictions.   ACTIVITY LIMITATIONS: continence and toileting  PARTICIPATION LIMITATIONS: community activity  PERSONAL FACTORS: Time since onset of injury/illness/exacerbation are also affecting patient's functional outcome.   REHAB POTENTIAL: Good  CLINICAL DECISION MAKING: Stable/uncomplicated  EVALUATION COMPLEXITY:  Low   GOALS: Goals reviewed with patient? Yes  SHORT TERM GOALS: Target date: 09/05/24  Patient able to perform diaphragmatic breathing to lengthen her pelvic floor.  Baseline:not educated yet Goal status: Met 08/22/24  2.  Patient educated on positions to lay in to assist with reduction of prolapse by the end of the day.  Baseline: not educated yet.  Goal status: Met 08/22/24   LONG TERM GOALS: Target date: 02/07/24  Patient independent with advanced HEP for core and pelvic floor strength.  Baseline: not educated yet Goal status: INITIAL  2.  Patient educated on postures that reduce the stress on her prolapse to reduce progress.  Baseline: not educated yet Goal status: INITIAL  3.  Patient educated on lifting with decreased pressure on the pelvic floor to reduce progression of prolapse.  Baseline: not educated yet Goal status: INITIAL  4.  Patient educated on prolapse management for daily tasks. Baseline: not educated yet Goal status: INITIAL  5.  Patient is able to perform maneuvers to fully empty her bladder when on the commode.  Baseline:  Goal status: Met 08/29/24   PLAN:  PT FREQUENCY: 1x/week  PT DURATION: 6 months  PLANNED INTERVENTIONS: 97110-Therapeutic exercises, 97530- Therapeutic activity, 97112- Neuromuscular re-education, 97535- Self Care, [id number]- Manual therapy, [id number]- Electrical stimulation (unattended), Patient/Family education, Joint mobilization, Spinal mobilization, Cryotherapy, Moist heat, and Biofeedback  PLAN FOR NEXT SESSION: Review HEP and possible discharge   Channing Pereyra, PT 08/29/24 2:48 PM  PHYSICAL THERAPY DISCHARGE SUMMARY  Visits from Start of Care: 3  Current functional level related to goals / functional outcomes: See above. Patient was doing well and her last appointment she cancelled and it was going to be a possible discharge.    Remaining deficits: See above.    Education / Equipment: HEP   Patient agrees to  discharge. Patient goals were partially met. Patient is being discharged due to not returning since the last visit. Thank you for the referral.   Channing Pereyra, PT 10/09/2024 9:39 AM   "

## 2024-09-03 ENCOUNTER — Encounter: Admitting: Physical Therapy

## 2024-09-10 ENCOUNTER — Encounter: Admitting: Physical Therapy

## 2024-09-17 ENCOUNTER — Encounter: Admitting: Physical Therapy
